# Patient Record
Sex: Female | Born: 1985 | Race: White | Hispanic: No | Marital: Single | Smoking: Current some day smoker
Health system: Southern US, Community
[De-identification: ages and names within clinical notes are randomized; demographics above are authoritative.]

## PROBLEM LIST (undated history)

## (undated) ENCOUNTER — Ambulatory Visit: Payer: MEDICAID | Source: Home / Self Care

## (undated) ENCOUNTER — Ambulatory Visit: Payer: MEDICAID

## (undated) ENCOUNTER — Inpatient Hospital Stay (HOSPITAL_COMMUNITY): Payer: Self-pay

## (undated) DIAGNOSIS — F32A Depression, unspecified: Secondary | ICD-10-CM

## (undated) DIAGNOSIS — F1910 Other psychoactive substance abuse, uncomplicated: Secondary | ICD-10-CM

## (undated) DIAGNOSIS — F329 Major depressive disorder, single episode, unspecified: Secondary | ICD-10-CM

## (undated) DIAGNOSIS — F1110 Opioid abuse, uncomplicated: Secondary | ICD-10-CM

## (undated) DIAGNOSIS — B1920 Unspecified viral hepatitis C without hepatic coma: Secondary | ICD-10-CM

## (undated) HISTORY — DX: Major depressive disorder, single episode, unspecified: F32.9

## (undated) HISTORY — PX: HAND SURGERY: SHX662

## (undated) HISTORY — PX: OTHER SURGICAL HISTORY: SHX169

## (undated) HISTORY — DX: Depression, unspecified: F32.A

## (undated) HISTORY — PX: LEG SURGERY: SHX1003

## (undated) HISTORY — PX: CHOLECYSTECTOMY: SHX55

---

## 2005-07-23 ENCOUNTER — Emergency Department (HOSPITAL_COMMUNITY): Admission: EM | Admit: 2005-07-23 | Discharge: 2005-07-23 | Payer: Self-pay | Admitting: Emergency Medicine

## 2011-02-25 ENCOUNTER — Other Ambulatory Visit (HOSPITAL_COMMUNITY): Payer: Self-pay | Admitting: Physician Assistant

## 2011-02-25 DIAGNOSIS — Q909 Down syndrome, unspecified: Secondary | ICD-10-CM

## 2011-02-25 DIAGNOSIS — IMO0001 Reserved for inherently not codable concepts without codable children: Secondary | ICD-10-CM

## 2011-03-06 ENCOUNTER — Other Ambulatory Visit (HOSPITAL_COMMUNITY): Payer: Self-pay

## 2011-03-10 ENCOUNTER — Other Ambulatory Visit (HOSPITAL_COMMUNITY): Payer: Self-pay

## 2011-03-10 ENCOUNTER — Ambulatory Visit (HOSPITAL_COMMUNITY)
Admission: RE | Admit: 2011-03-10 | Discharge: 2011-03-10 | Disposition: A | Discharge status: Home / Self Care | Payer: Medicaid Other | Source: Ambulatory Visit | Attending: Obstetrics and Gynecology | Admitting: Obstetrics and Gynecology

## 2011-03-10 ENCOUNTER — Other Ambulatory Visit (HOSPITAL_COMMUNITY): Payer: Self-pay | Admitting: Physician Assistant

## 2011-03-10 ENCOUNTER — Encounter (HOSPITAL_COMMUNITY): Payer: Self-pay

## 2011-03-10 DIAGNOSIS — Q909 Down syndrome, unspecified: Secondary | ICD-10-CM

## 2011-03-10 DIAGNOSIS — O358XX0 Maternal care for other (suspected) fetal abnormality and damage, not applicable or unspecified: Secondary | ICD-10-CM | POA: Insufficient documentation

## 2011-04-23 ENCOUNTER — Ambulatory Visit (HOSPITAL_COMMUNITY)
Admission: RE | Admit: 2011-04-23 | Discharge: 2011-04-23 | Disposition: A | Discharge status: Home / Self Care | Payer: Medicaid Other | Source: Ambulatory Visit | Attending: Obstetrics and Gynecology | Admitting: Obstetrics and Gynecology

## 2011-04-23 ENCOUNTER — Ambulatory Visit (HOSPITAL_COMMUNITY): Payer: Medicaid Other

## 2011-04-23 DIAGNOSIS — Z363 Encounter for antenatal screening for malformations: Secondary | ICD-10-CM | POA: Insufficient documentation

## 2011-04-23 DIAGNOSIS — O358XX0 Maternal care for other (suspected) fetal abnormality and damage, not applicable or unspecified: Secondary | ICD-10-CM | POA: Insufficient documentation

## 2011-04-23 DIAGNOSIS — Z1389 Encounter for screening for other disorder: Secondary | ICD-10-CM | POA: Insufficient documentation

## 2011-04-23 DIAGNOSIS — O352XX0 Maternal care for (suspected) hereditary disease in fetus, not applicable or unspecified: Secondary | ICD-10-CM | POA: Insufficient documentation

## 2011-04-23 DIAGNOSIS — Q909 Down syndrome, unspecified: Secondary | ICD-10-CM

## 2012-10-26 ENCOUNTER — Other Ambulatory Visit (HOSPITAL_COMMUNITY)
Admission: RE | Admit: 2012-10-26 | Discharge: 2012-10-26 | Disposition: A | Discharge status: Home / Self Care | Payer: Medicaid Other | Source: Ambulatory Visit | Attending: Obstetrics and Gynecology | Admitting: Obstetrics and Gynecology

## 2012-10-26 DIAGNOSIS — Z01419 Encounter for gynecological examination (general) (routine) without abnormal findings: Secondary | ICD-10-CM | POA: Insufficient documentation

## 2012-10-26 DIAGNOSIS — Z113 Encounter for screening for infections with a predominantly sexual mode of transmission: Secondary | ICD-10-CM | POA: Insufficient documentation

## 2012-10-26 LAB — OB RESULTS CONSOLE ABO/RH: RH Type: POSITIVE

## 2012-10-26 LAB — OB RESULTS CONSOLE VARICELLA ZOSTER ANTIBODY, IGG: Varicella: IMMUNE

## 2012-10-26 LAB — OB RESULTS CONSOLE HEPATITIS B SURFACE ANTIGEN: Hepatitis B Surface Ag: NEGATIVE

## 2012-12-21 NOTE — L&D Delivery Note (Signed)
Pamela Zimmerman is a 27 y.o. G2P1102 at [redacted]w[redacted]d who delivered precipitously in the MAU.  Delivery Note At 11:22 AM a viable female was delivered via Vaginal, Spontaneous Delivery (vertex presentation, too precipitous to know exactly).  APGAR: 7, 8; weight 5 lb 10.3 oz (2560 g).   Placenta status: Intact, Spontaneous.  Cord: 3 vessels with the following complications: none.  Cord pH: 7.257  Anesthesia: None  Episiotomy: None Lacerations: None Suture Repair: n/a Est. Blood Loss (mL): 300  Mom to postpartum.  Baby to NICU.  Levert Feinstein 03/23/2013, 6:13 PM  I was present for entire delivery and agree with above. NICU called and arrived shortly after delivery to examine baby. Baby did cry immediately following delivery and had good tone and color.  Napoleon Form, MD

## 2013-03-02 ENCOUNTER — Encounter: Payer: Self-pay | Admitting: *Deleted

## 2013-03-02 DIAGNOSIS — F32A Depression, unspecified: Secondary | ICD-10-CM | POA: Insufficient documentation

## 2013-03-02 DIAGNOSIS — F329 Major depressive disorder, single episode, unspecified: Secondary | ICD-10-CM | POA: Insufficient documentation

## 2013-03-15 ENCOUNTER — Other Ambulatory Visit: Payer: Self-pay | Admitting: Obstetrics & Gynecology

## 2013-03-15 DIAGNOSIS — O0933 Supervision of pregnancy with insufficient antenatal care, third trimester: Secondary | ICD-10-CM

## 2013-03-15 DIAGNOSIS — F1920 Other psychoactive substance dependence, uncomplicated: Secondary | ICD-10-CM

## 2013-03-17 ENCOUNTER — Ambulatory Visit (INDEPENDENT_AMBULATORY_CARE_PROVIDER_SITE_OTHER): Payer: Medicaid Other | Admitting: Obstetrics & Gynecology

## 2013-03-17 ENCOUNTER — Ambulatory Visit (INDEPENDENT_AMBULATORY_CARE_PROVIDER_SITE_OTHER): Payer: Medicaid Other

## 2013-03-17 VITALS — BP 140/100 | Wt 153.0 lb

## 2013-03-17 DIAGNOSIS — Z3490 Encounter for supervision of normal pregnancy, unspecified, unspecified trimester: Secondary | ICD-10-CM | POA: Insufficient documentation

## 2013-03-17 DIAGNOSIS — F1920 Other psychoactive substance dependence, uncomplicated: Secondary | ICD-10-CM

## 2013-03-17 DIAGNOSIS — O0930 Supervision of pregnancy with insufficient antenatal care, unspecified trimester: Secondary | ICD-10-CM

## 2013-03-17 DIAGNOSIS — O99330 Smoking (tobacco) complicating pregnancy, unspecified trimester: Secondary | ICD-10-CM

## 2013-03-17 DIAGNOSIS — O0933 Supervision of pregnancy with insufficient antenatal care, third trimester: Secondary | ICD-10-CM

## 2013-03-17 DIAGNOSIS — Z3483 Encounter for supervision of other normal pregnancy, third trimester: Secondary | ICD-10-CM

## 2013-03-17 DIAGNOSIS — Z331 Pregnant state, incidental: Secondary | ICD-10-CM

## 2013-03-17 DIAGNOSIS — O99320 Drug use complicating pregnancy, unspecified trimester: Secondary | ICD-10-CM

## 2013-03-17 DIAGNOSIS — Z1389 Encounter for screening for other disorder: Secondary | ICD-10-CM

## 2013-03-17 LAB — POCT URINALYSIS DIPSTICK
Glucose, UA: NEGATIVE
Leukocytes, UA: NEGATIVE
Nitrite, UA: NEGATIVE
Protein, UA: NEGATIVE

## 2013-03-17 MED ORDER — OMEPRAZOLE 20 MG PO CPDR: 20.0000 mg | DELAYED_RELEASE_CAPSULE | Freq: Every day | ORAL | Status: DC

## 2013-03-17 NOTE — Progress Notes (Signed)
U/S-35+1wks-vtx active female fetus, EFW 4 lb 12 oz (2145 gms) 16th%tile, fluid wnl AFI=12.3cm, ant gr 2 plac, profile and cardiac OFT's noted today

## 2013-03-17 NOTE — Progress Notes (Signed)
RECHECK B/P 148/100.

## 2013-03-17 NOTE — Progress Notes (Signed)
Patient reports good fetal movement, denies any bleeding and no rupture of membranes symptoms or contraction No problems, no complaints, all questions answered.  Sonogram reviewed ith patient see full report in imaging. Having esophogeal spasm try Prilosec

## 2013-03-17 NOTE — Progress Notes (Signed)
Pt states not taking pnv due to constipation.

## 2013-03-17 NOTE — Patient Instructions (Signed)
Pregnancy - Third Trimester  The third trimester of pregnancy (the last 3 months) is a period of the most rapid growth for you and your baby. The baby approaches a length of 20 inches and a weight of 6 to 10 pounds. The baby is adding on fat and getting ready for life outside your body. While inside, babies have periods of sleeping and waking, suck their thumbs, and hiccups. You can often feel small contractions of the uterus. This is false labor. It is also called Braxton-Hicks contractions. This is like a practice for labor. The usual problems in this stage of pregnancy include more difficulty breathing, swelling of the hands and feet from water retention, and having to urinate more often because of the uterus and baby pressing on your bladder.   PRENATAL EXAMS  · Blood work may continue to be done during prenatal exams. These tests are done to check on your health and the probable health of your baby. Blood work is used to follow your blood levels (hemoglobin). Anemia (low hemoglobin) is common during pregnancy. Iron and vitamins are given to help prevent this. You may also continue to be checked for diabetes. Some of the past blood tests may be done again.  · The size of the uterus is measured during each visit. This makes sure your baby is growing properly according to your pregnancy dates.  · Your blood pressure is checked every prenatal visit. This is to make sure you are not getting toxemia.  · Your urine is checked every prenatal visit for infection, diabetes and protein.  · Your weight is checked at each visit. This is done to make sure gains are happening at the suggested rate and that you and your baby are growing normally.  · Sometimes, an ultrasound is performed to confirm the position and the proper growth and development of the baby. This is a test done that bounces harmless sound waves off the baby so your caregiver can more accurately determine due dates.  · Discuss the type of pain medication and  anesthesia you will have during your labor and delivery.  · Discuss the possibility and anesthesia if a Cesarean Section might be necessary.  · Inform your caregiver if there is any mental or physical violence at home.  Sometimes, a specialized non-stress test, contraction stress test and biophysical profile are done to make sure the baby is not having a problem. Checking the amniotic fluid surrounding the baby is called an amniocentesis. The amniotic fluid is removed by sticking a needle into the belly (abdomen). This is sometimes done near the end of pregnancy if an early delivery is required. In this case, it is done to help make sure the baby's lungs are mature enough for the baby to live outside of the womb. If the lungs are not mature and it is unsafe to deliver the baby, an injection of cortisone medication is given to the mother 1 to 2 days before the delivery. This helps the baby's lungs mature and makes it safer to deliver the baby.  CHANGES OCCURING IN THE THIRD TRIMESTER OF PREGNANCY  Your body goes through many changes during pregnancy. They vary from person to person. Talk to your caregiver about changes you notice and are concerned about.  · During the last trimester, you have probably had an increase in your appetite. It is normal to have cravings for certain foods. This varies from person to person and pregnancy to pregnancy.  · You may begin to   get stretch marks on your hips, abdomen, and breasts. These are normal changes in the body during pregnancy. There are no exercises or medications to take which prevent this change.  · Constipation may be treated with a stool softener or adding bulk to your diet. Drinking lots of fluids, fiber in vegetables, fruits, and whole grains are helpful.  · Exercising is also helpful. If you have been very active up until your pregnancy, most of these activities can be continued during your pregnancy. If you have been less active, it is helpful to start an exercise  program such as walking. Consult your caregiver before starting exercise programs.  · Avoid all smoking, alcohol, un-prescribed drugs, herbs and "street drugs" during your pregnancy. These chemicals affect the formation and growth of the baby. Avoid chemicals throughout the pregnancy to ensure the delivery of a healthy infant.  · Backache, varicose veins and hemorrhoids may develop or get worse.  · You will tire more easily in the third trimester, which is normal.  · The baby's movements may be stronger and more often.  · You may become short of breath easily.  · Your belly button may stick out.  · A yellow discharge may leak from your breasts called colostrum.  · You may have a bloody mucus discharge. This usually occurs a few days to a week before labor begins.  HOME CARE INSTRUCTIONS   · Keep your caregiver's appointments. Follow your caregiver's instructions regarding medication use, exercise, and diet.  · During pregnancy, you are providing food for you and your baby. Continue to eat regular, well-balanced meals. Choose foods such as meat, fish, milk and other low fat dairy products, vegetables, fruits, and whole-grain breads and cereals. Your caregiver will tell you of the ideal weight gain.  · A physical sexual relationship may be continued throughout pregnancy if there are no other problems such as early (premature) leaking of amniotic fluid from the membranes, vaginal bleeding, or belly (abdominal) pain.  · Exercise regularly if there are no restrictions. Check with your caregiver if you are unsure of the safety of your exercises. Greater weight gain will occur in the last 2 trimesters of pregnancy. Exercising helps:  · Control your weight.  · Get you in shape for labor and delivery.  · You lose weight after you deliver.  · Rest a lot with legs elevated, or as needed for leg cramps or low back pain.  · Wear a good support or jogging bra for breast tenderness during pregnancy. This may help if worn during  sleep. Pads or tissues may be used in the bra if you are leaking colostrum.  · Do not use hot tubs, steam rooms, or saunas.  · Wear your seat belt when driving. This protects you and your baby if you are in an accident.  · Avoid raw meat, cat litter boxes and soil used by cats. These carry germs that can cause birth defects in the baby.  · It is easier to loose urine during pregnancy. Tightening up and strengthening the pelvic muscles will help with this problem. You can practice stopping your urination while you are going to the bathroom. These are the same muscles you need to strengthen. It is also the muscles you would use if you were trying to stop from passing gas. You can practice tightening these muscles up 10 times a set and repeating this about 3 times per day. Once you know what muscles to tighten up, do not perform these   exercises during urination. It is more likely to cause an infection by backing up the urine.  · Ask for help if you have financial, counseling or nutritional needs during pregnancy. Your caregiver will be able to offer counseling for these needs as well as refer you for other special needs.  · Make a list of emergency phone numbers and have them available.  · Plan on getting help from family or friends when you go home from the hospital.  · Make a trial run to the hospital.  · Take prenatal classes with the father to understand, practice and ask questions about the labor and delivery.  · Prepare the baby's room/nursery.  · Do not travel out of the city unless it is absolutely necessary and with the advice of your caregiver.  · Wear only low or no heal shoes to have better balance and prevent falling.  MEDICATIONS AND DRUG USE IN PREGNANCY  · Take prenatal vitamins as directed. The vitamin should contain 1 milligram of folic acid. Keep all vitamins out of reach of children. Only a couple vitamins or tablets containing iron may be fatal to a baby or young child when ingested.  · Avoid use  of all medications, including herbs, over-the-counter medications, not prescribed or suggested by your caregiver. Only take over-the-counter or prescription medicines for pain, discomfort, or fever as directed by your caregiver. Do not use aspirin, ibuprofen (Motrin®, Advil®, Nuprin®) or naproxen (Aleve®) unless OK'd by your caregiver.  · Let your caregiver also know about herbs you may be using.  · Alcohol is related to a number of birth defects. This includes fetal alcohol syndrome. All alcohol, in any form, should be avoided completely. Smoking will cause low birth rate and premature babies.  · Street/illegal drugs are very harmful to the baby. They are absolutely forbidden. A baby born to an addicted mother will be addicted at birth. The baby will go through the same withdrawal an adult does.  SEEK MEDICAL CARE IF:  You have any concerns or worries during your pregnancy. It is better to call with your questions if you feel they cannot wait, rather than worry about them.  DECISIONS ABOUT CIRCUMCISION  You may or may not know the sex of your baby. If you know your baby is a boy, it may be time to think about circumcision. Circumcision is the removal of the foreskin of the penis. This is the skin that covers the sensitive end of the penis. There is no proven medical need for this. Often this decision is made on what is popular at the time or based upon religious beliefs and social issues. You can discuss these issues with your caregiver or pediatrician.  SEEK IMMEDIATE MEDICAL CARE IF:   · An unexplained oral temperature above 102° F (38.9° C) develops, or as your caregiver suggests.  · You have leaking of fluid from the vagina (birth canal). If leaking membranes are suspected, take your temperature and tell your caregiver of this when you call.  · There is vaginal spotting, bleeding or passing clots. Tell your caregiver of the amount and how many pads are used.  · You develop a bad smelling vaginal discharge with  a change in the color from clear to white.  · You develop vomiting that lasts more than 24 hours.  · You develop chills or fever.  · You develop shortness of breath.  · You develop burning on urination.  · You loose more than 2 pounds of weight   or gain more than 2 pounds of weight or as suggested by your caregiver.  · You notice sudden swelling of your face, hands, and feet or legs.  · You develop belly (abdominal) pain. Round ligament discomfort is a common non-cancerous (benign) cause of abdominal pain in pregnancy. Your caregiver still must evaluate you.  · You develop a severe headache that does not go away.  · You develop visual problems, blurred or double vision.  · If you have not felt your baby move for more than 1 hour. If you think the baby is not moving as much as usual, eat something with sugar in it and lie down on your left side for an hour. The baby should move at least 4 to 5 times per hour. Call right away if your baby moves less than that.  · You fall, are in a car accident or any kind of trauma.  · There is mental or physical violence at home.  Document Released: 12/01/2001 Document Revised: 02/29/2012 Document Reviewed: 06/05/2009  ExitCare® Patient Information ©2013 [employer].

## 2013-03-20 LAB — US OB FOLLOW UP

## 2013-03-23 ENCOUNTER — Inpatient Hospital Stay (HOSPITAL_COMMUNITY)
Admission: AD | Admit: 2013-03-23 | Discharge: 2013-03-25 | DRG: 775 | Disposition: A | Discharge status: Home / Self Care | Payer: Medicaid Other | Source: Ambulatory Visit | Attending: Obstetrics & Gynecology | Admitting: Obstetrics & Gynecology

## 2013-03-23 ENCOUNTER — Encounter (HOSPITAL_COMMUNITY): Payer: Self-pay | Admitting: *Deleted

## 2013-03-23 DIAGNOSIS — Z3483 Encounter for supervision of other normal pregnancy, third trimester: Secondary | ICD-10-CM

## 2013-03-23 LAB — CBC
HCT: 39.6 % (ref 36.0–46.0)
Hemoglobin: 13.4 g/dL (ref 12.0–15.0)
MCV: 90.6 fL (ref 78.0–100.0)
RBC: 4.37 MIL/uL (ref 3.87–5.11)
WBC: 16.8 10*3/uL — ABNORMAL HIGH (ref 4.0–10.5)

## 2013-03-23 MED ORDER — OXYCODONE-ACETAMINOPHEN 5-325 MG PO TABS
1.0000 | ORAL_TABLET | ORAL | Status: DC | PRN
  Administered 2013-03-23 – 2013-03-24 (×3): 2 via ORAL
  Administered 2013-03-24 – 2013-03-25 (×3): 1 via ORAL
  Filled 2013-03-23 (×3): qty 1
  Filled 2013-03-23 (×3): qty 2

## 2013-03-23 MED ORDER — SIMETHICONE 80 MG PO CHEW: 80.0000 mg | CHEWABLE_TABLET | ORAL | Status: DC | PRN

## 2013-03-23 MED ORDER — BENZOCAINE-MENTHOL 20-0.5 % EX AERO: 1.0000 "application " | INHALATION_SPRAY | CUTANEOUS | Status: DC | PRN

## 2013-03-23 MED ORDER — DIBUCAINE 1 % RE OINT: 1.0000 "application " | TOPICAL_OINTMENT | RECTAL | Status: DC | PRN

## 2013-03-23 MED ORDER — IBUPROFEN 600 MG PO TABS
600.0000 mg | ORAL_TABLET | Freq: Four times a day (QID) | ORAL | Status: DC
  Administered 2013-03-23 – 2013-03-25 (×8): 600 mg via ORAL
  Filled 2013-03-23 (×8): qty 1

## 2013-03-23 MED ORDER — ZOLPIDEM TARTRATE 5 MG PO TABS: 5.0000 mg | ORAL_TABLET | Freq: Every evening | ORAL | Status: DC | PRN

## 2013-03-23 MED ORDER — ONDANSETRON HCL 4 MG/2ML IJ SOLN: 4.0000 mg | INTRAMUSCULAR | Status: DC | PRN

## 2013-03-23 MED ORDER — PNEUMOCOCCAL VAC POLYVALENT 25 MCG/0.5ML IJ INJ
0.5000 mL | INJECTION | INTRAMUSCULAR | Status: AC
  Administered 2013-03-25: 0.5 mL via INTRAMUSCULAR
  Filled 2013-03-23: qty 0.5

## 2013-03-23 MED ORDER — INFLUENZA VIRUS VACC SPLIT PF IM SUSP
0.5000 mL | INTRAMUSCULAR | Status: AC
  Administered 2013-03-24: 0.5 mL via INTRAMUSCULAR
  Filled 2013-03-23: qty 0.5

## 2013-03-23 MED ORDER — ONDANSETRON HCL 4 MG PO TABS: 4.0000 mg | ORAL_TABLET | ORAL | Status: DC | PRN

## 2013-03-23 MED ORDER — LANOLIN HYDROUS EX OINT: TOPICAL_OINTMENT | CUTANEOUS | Status: DC | PRN

## 2013-03-23 MED ORDER — MORPHINE SULFATE 4 MG/ML IJ SOLN
2.0000 mg | Freq: Once | INTRAMUSCULAR | Status: AC
  Administered 2013-03-23: 2 mg via INTRAMUSCULAR
  Filled 2013-03-23: qty 1

## 2013-03-23 MED ORDER — PRENATAL MULTIVITAMIN CH
1.0000 | ORAL_TABLET | Freq: Every day | ORAL | Status: DC
  Administered 2013-03-23 – 2013-03-24 (×2): 1 via ORAL
  Filled 2013-03-23 (×2): qty 1

## 2013-03-23 MED ORDER — TETANUS-DIPHTH-ACELL PERTUSSIS 5-2.5-18.5 LF-MCG/0.5 IM SUSP
0.5000 mL | Freq: Once | INTRAMUSCULAR | Status: AC
  Administered 2013-03-24: 0.5 mL via INTRAMUSCULAR
  Filled 2013-03-23: qty 0.5

## 2013-03-23 MED ORDER — SENNOSIDES-DOCUSATE SODIUM 8.6-50 MG PO TABS
2.0000 | ORAL_TABLET | Freq: Every day | ORAL | Status: DC
  Administered 2013-03-23 – 2013-03-24 (×2): 2 via ORAL

## 2013-03-23 MED ORDER — WITCH HAZEL-GLYCERIN EX PADS: 1.0000 "application " | MEDICATED_PAD | CUTANEOUS | Status: DC | PRN

## 2013-03-23 MED ORDER — DIPHENHYDRAMINE HCL 25 MG PO CAPS: 25.0000 mg | ORAL_CAPSULE | Freq: Four times a day (QID) | ORAL | Status: DC | PRN

## 2013-03-23 NOTE — H&P (Signed)
Pamela Zimmerman is a 27 y.o. female who presented to the MAU fully dilated and contracting and precipitously delivered in MAU bed. See delivery summary for complete details. Baby was transported to NICU from MAU.  History OB History   Grav Para Term Preterm Abortions TAB SAB Ect Mult Living   2 2 1 1      2     Previous pregnancy was delivered full term, no problems during that pregnancy. [facility], no problems during this pregnancy.  Past Medical History  Diagnosis Date  . Depression     had post partum depressin with last delivery  Pt denies having any medical problems  Past Surgical History  Procedure Laterality Date  . Right hand     Family History: family history includes Cancer in her mother; Cystic fibrosis in her other; Deafness in her brother; Diabetes in her father; and Stroke in her father. Cancer in family. Niece with cystic fibrosis.  Social History: Patient reports smoking 1 ppd for 5-6 years. Denies alcohol or drug use.   Medicines: heartburn medicines, PNV (took at end of pregnancy)  Allergies: none  Prenatal Transfer Tool  Maternal Diabetes: No Genetic Screening: Declined (not in records) Maternal Ultrasounds/Referrals: Normal Fetal Ultrasounds or other Referrals:  None Maternal Substance Abuse:  Yes:  Type: Smoker, Other: prenatal records show + benzos in urine Significant Maternal Medications:  Meds include: Other: records show pt took paxil, seroquel, lorazepam but unsure if took these during her pregnancy Significant Maternal Lab Results:  Lab values include: Other:  +UDS for benzos per prenatal records Other Comments:  no showed to several prenatal appointments  ROS  Dilation: 10 Effacement (%): 100 Station: Crowning Exam by:: K.WIlson,RN Blood pressure 120/78, pulse 62, temperature 98.2 F (36.8 C), temperature source Oral, resp. rate 18, last menstrual period 07/05/2012, SpO2 99.00%, unknown if  currently breastfeeding. Exam Physical Exam  Gen: NAD HEENT: normocephalic, atraumatic, MMM Heart: RRR Lungs: CTAB Abd: soft, nontender to palpation Ext: no lower extremity edema Neuro: nonfocal, speech intact  Prenatal labs: ABO, Rh: O/Positive/-- (11/06 0000) Antibody: Negative (11/06 0000) Rubella: Immune (11/06 0000) RPR: Nonreactive (11/06 0000)  HBsAg: Negative (11/06 0000)  HIV: Non-reactive (11/06 0000)  GBS:   unknown  Assessment/Plan: Pamela Zimmerman is a 27 y.o. G2P1102 at [redacted]w[redacted]d who has delivered precipitously in the MAU.  -Admit to postpartum unit for normal postpartum care (baby in NICU) -check CBC, RPR now -unsure about contraception post-delivery but would like information -considering breastfeeding    Levert Feinstein 03/23/2013, 4:19 PM  I saw and examined patient and agree with above resident note. I reviewed prenatal history, labs, imaging, vitals and fetal heart tracing. FHT was very brief since patient delivered soon after arrival. Baseline was in the 100s with decels to 80s with contractions and pushing. Baby was vigorous and cried at birth.  NICU called and arrived shortly after birth. See delivery note. Napoleon Form, MD

## 2013-03-23 NOTE — MAU Note (Signed)
Pt arrived via EMS c/o ctx q 2 min. Denies SROM at this time no urge to push. Had pt get undressed and place on monitor FHR 107 SVE 10/100/+1. Called Dr. Thad Ranger for delivery.

## 2013-03-23 NOTE — Progress Notes (Signed)
UR completed 

## 2013-03-23 NOTE — Consult Note (Signed)
Delivery Note: Asked by Dr Erin Fulling to attend delivery of this baby in MAU for prematurity at 36 weeks. [place] labs unknown at the moment.  Mom came in full dilated, ROM shortly before delivery. Precipitous labor SVD. Infant had a weak cry at birth. Bulb suctioned and stimulated. Noted to have audible grunting and retracting shortly after delivery. Apgars 7/8. Given Neopuff for peep. Shown to mom then taken to NICU for continued medical care.  Chirag Krueger Q

## 2013-03-24 ENCOUNTER — Encounter: Payer: Medicaid Other | Admitting: Obstetrics & Gynecology

## 2013-03-24 NOTE — Progress Notes (Signed)
Post Partum Day 1 Subjective: no complaints, up ad lib, voiding and tolerating PO, small lochia, plans to breastfeed, Nexplanon for contraception  Objective: Blood pressure 125/87, pulse 61, temperature 97.5 F (36.4 C), temperature source Oral, resp. rate 18, height 5\' 6"  (1.676 m), weight 69.4 kg (153 lb), last menstrual period 07/05/2012, SpO2 99.00%, unknown if currently breastfeeding. Baby in NICU 2/2 prematurity Physical Exam:  General: alert, cooperative and no distress Lochia:normal flow Chest: CTAB Heart: RRR no m/r/g Abdomen: +BS, soft, nontender,  Uterine Fundus: firm DVT Evaluation: No evidence of DVT seen on physical exam. Extremities: trace edema   Recent Labs  03/23/13 1226  HGB 13.4  HCT 39.6    Assessment/Plan: Plan for discharge tomorrow   LOS: 1 day   CRESENZO-DISHMAN,Dereon Corkery 03/24/2013, 8:04 AM

## 2013-03-24 NOTE — Lactation Note (Addendum)
This note was copied from the chart of Pamela Zimmerman. Lactation Consultation Note Mom states pumping is going well; denies questions; request pump rental. Rental packet provided, will check on later. 1330 Checked on mom, she has not filled out the pump rental packet yet; denies questions at this time. Inst mom to call when ready to complete rental packet and make down payment, and to call and request help if she has any concerns.    Patient Name: Pamela Zimmerman [id number] Date: 03/24/2013 Reason for consult: Follow-up assessment   Maternal Data    Feeding    LATCH Score/Interventions                      Lactation Tools Discussed/Used     Consult Status Consult Status: Follow-up    Octavio Manns [facility] 03/24/2013, 11:48 AM

## 2013-03-25 MED ORDER — IBUPROFEN 600 MG PO TABS: 600.0000 mg | ORAL_TABLET | Freq: Four times a day (QID) | ORAL | Status: DC | PRN

## 2013-03-25 NOTE — Progress Notes (Signed)
Discharge instructions reviewed with patient.  Patient states understanding of home care, medications, signs/symptoms to report to MD, activity and return MD office visit.  No home equipment needed.  Patient ambulated to NICU and will discharge home in stable condition and without incident.

## 2013-03-25 NOTE — Discharge Summary (Signed)
Obstetric Discharge Summary Reason for Admission: onset of labor, 10cm upon arrival to MAU via EMS Prenatal Procedures: none Intrapartum Procedures:  spontaneous vaginal delivery in MAU Postpartum Procedures: none Complications-Operative and Postpartum: GBS unknown, no time to treat prior to birth, infant to NICU Hemoglobin  Date Value Range Status  03/23/2013 13.4  12.0 - 15.0 g/dL Final     HCT  Date Value Range Status  03/23/2013 39.6  36.0 - 46.0 % Final    Physical Exam:  General: alert, cooperative and no distress Lochia: appropriate Uterine Fundus: firm Incision: n/a DVT Evaluation: No evidence of DVT seen on physical exam. Negative Homan's sign. No cords or calf tenderness. No significant calf/ankle edema.  Discharge Diagnoses: Preterm birth @ 89wks in MAU  Discharge Information: Date: 03/25/2013 Activity: pelvic rest Diet: routine Medications: PNV and Ibuprofen Condition: stable Instructions: refer to practice specific booklet Discharge to: home Follow-up Information   Follow up with [facility]. Schedule an appointment as soon as possible for a visit in 6 weeks.   Contact information:   [address] [place] [address] [phone]      Newborn Data: Live born female  Birth Weight: 5 lb 10.3 oz (2560 g) APGAR: 7, 8  Infant in NICU, will not be d/c'd w/ mother Pumping and taking milk to NICU Mom plans OP circumcision at FT Undecided about contraception  Marge Duncans 03/25/2013, 9:45 AM

## 2013-03-27 NOTE — H&P (Signed)
Attestation of Attending Supervision of Advanced Practitioner (CNM/NP): Evaluation and management procedures were performed by the Advanced Practitioner under my supervision and collaboration.  I have reviewed the Advanced Practitioner's note and chart, and I agree with the management and plan.  HARRAWAY-SMITH, Amiylah Anastos 6:31 PM     

## 2013-04-24 ENCOUNTER — Ambulatory Visit: Payer: Medicaid Other | Admitting: Women's Health

## 2013-05-09 ENCOUNTER — Ambulatory Visit: Payer: Medicaid Other | Admitting: Advanced Practice Midwife

## 2013-05-10 ENCOUNTER — Ambulatory Visit: Payer: Medicaid Other | Admitting: Advanced Practice Midwife

## 2013-09-26 ENCOUNTER — Other Ambulatory Visit: Payer: Medicaid Other | Admitting: Nurse Practitioner

## 2013-11-14 ENCOUNTER — Ambulatory Visit: Payer: Medicaid Other | Admitting: Family Medicine

## 2014-10-22 ENCOUNTER — Encounter (HOSPITAL_COMMUNITY): Payer: Self-pay | Admitting: *Deleted

## 2014-12-12 ENCOUNTER — Encounter (HOSPITAL_COMMUNITY): Payer: Self-pay | Admitting: *Deleted

## 2014-12-12 DIAGNOSIS — Z8659 Personal history of other mental and behavioral disorders: Secondary | ICD-10-CM | POA: Insufficient documentation

## 2014-12-12 DIAGNOSIS — Z720 Tobacco use: Secondary | ICD-10-CM | POA: Insufficient documentation

## 2014-12-12 DIAGNOSIS — M542 Cervicalgia: Secondary | ICD-10-CM | POA: Diagnosis not present

## 2014-12-12 DIAGNOSIS — Z79899 Other long term (current) drug therapy: Secondary | ICD-10-CM | POA: Diagnosis not present

## 2014-12-12 DIAGNOSIS — M25531 Pain in right wrist: Secondary | ICD-10-CM | POA: Insufficient documentation

## 2014-12-12 DIAGNOSIS — Z9889 Other specified postprocedural states: Secondary | ICD-10-CM | POA: Diagnosis not present

## 2014-12-12 DIAGNOSIS — M25431 Effusion, right wrist: Secondary | ICD-10-CM | POA: Insufficient documentation

## 2014-12-12 NOTE — ED Notes (Signed)
The pt  Was injected withMOLLEY  In her rt arm 2 days ago.  She reports that  Her arm is swollen and painful  lmp last week

## 2014-12-13 ENCOUNTER — Emergency Department (HOSPITAL_COMMUNITY): Payer: Medicaid Other

## 2014-12-13 ENCOUNTER — Emergency Department (HOSPITAL_COMMUNITY)
Admission: EM | Admit: 2014-12-13 | Discharge: 2014-12-13 | Disposition: A | Discharge status: Home / Self Care | Payer: Medicaid Other | Attending: Emergency Medicine | Admitting: Emergency Medicine

## 2014-12-13 DIAGNOSIS — M25539 Pain in unspecified wrist: Secondary | ICD-10-CM

## 2014-12-13 MED ORDER — IBUPROFEN 800 MG PO TABS
800.0000 mg | ORAL_TABLET | Freq: Once | ORAL | Status: AC
  Administered 2014-12-13: 800 mg via ORAL
  Filled 2014-12-13: qty 1

## 2014-12-13 MED ORDER — DOXYCYCLINE HYCLATE 100 MG PO TABS
100.0000 mg | ORAL_TABLET | Freq: Once | ORAL | Status: AC
  Administered 2014-12-13: 100 mg via ORAL
  Filled 2014-12-13: qty 1

## 2014-12-13 MED ORDER — DOXYCYCLINE HYCLATE 100 MG PO TABS: 100.0000 mg | ORAL_TABLET | Freq: Once | ORAL | Status: DC

## 2014-12-13 MED ORDER — CEPHALEXIN 250 MG PO CAPS
500.0000 mg | ORAL_CAPSULE | Freq: Once | ORAL | Status: AC
  Administered 2014-12-13: 500 mg via ORAL
  Filled 2014-12-13: qty 2

## 2014-12-13 MED ORDER — CEPHALEXIN 500 MG PO CAPS: 500.0000 mg | ORAL_CAPSULE | Freq: Two times a day (BID) | ORAL | Status: DC

## 2014-12-13 NOTE — Discharge Instructions (Signed)
Cellulitis Ms. Dayton ScrapeMurray, you were seen today for swelling in her wrist. X-ray does not reveal any injury or bone infection. Take Anaprox as prescribed and follow-up with primary care physician within one week for a wound check. If symptoms worsen come back to emergency department immediately for repeat evaluation. Thank you. Cellulitis is an infection of the skin and the tissue under the skin. The infected area is usually red and tender. This happens most often in the arms and lower legs. HOME CARE   Take your antibiotic medicine as told. Finish the medicine even if you start to feel better.  Keep the infected arm or leg raised (elevated).  Put a warm cloth on the area up to 4 times per day.  Only take medicines as told by your doctor.  Keep all doctor visits as told. GET HELP IF:  You see red streaks on the skin coming from the infected area.  Your red area gets bigger or turns a dark color.  Your bone or joint under the infected area is painful after the skin heals.  Your infection comes back in the same area or different area.  You have a puffy (swollen) bump in the infected area.  You have new symptoms.  You have a fever. GET HELP RIGHT AWAY IF:   You feel very sleepy.  You throw up (vomit) or have watery poop (diarrhea).  You feel sick and have muscle aches and pains. MAKE SURE YOU:   Understand these instructions.  Will watch your condition.  Will get help right away if you are not doing well or get worse. Document Released: 05/25/2008 Document Revised: 04/23/2014 Document Reviewed: 02/22/2012 [facility] Patient Information 2015 [employer], [place]. This information is not intended to replace advice given to you by your health care provider. Make sure you discuss any questions you have with your health care provider.

## 2014-12-13 NOTE — ED Provider Notes (Signed)
CSN: [id number]     Arrival date & time 12/12/14  2319 History  This chart was scribed for Pamela CrumbleAdeleke Alvine Mostafa, MD by Pamela Zimmerman, ED Scribe. This patient was seen in room D32C/D32C and the patient's care was started at 12:04 AM.      Chief Complaint  Patient presents with  . Wound Infection   The history is provided by the patient. No language interpreter was used.   HPI Comments: Pamela Zimmerman is a 28 y.o. female who presents to the Emergency Department complaining of wound onset 2 days prior. Pt states she injected her self with the street drug molly. She states that now her right wrist has been painful, red and swollen for the past 2 days. Pt states she has associated sweats. She states this was the first time injecting her self with molly. Denies trauma or injury to the right wrist.   Pt is also complaining of neck pain onset 2 days prior. Pt states that she first injected in her neck 2 days prior. She denies any injury or trauma to her neck.   Past Medical History  Diagnosis Date  . Depression     had post partum depressin with last delivery   Past Surgical History  Procedure Laterality Date  . Right hand     Family History  Problem Relation Age of Onset  . Cancer Mother     lung  . Diabetes Father   . Stroke Father   . Deafness Brother   . Cystic fibrosis Other    History  Substance Use Topics  . Smoking status: Current Every Day Smoker -- 0.50 packs/day    Types: Cigarettes  . Smokeless tobacco: Not on file  . Alcohol Use: No   OB History    Gravida Para Term Preterm AB TAB SAB Ectopic Multiple Living   2 2 1 1      2      Review of Systems  Musculoskeletal: Positive for joint swelling, arthralgias and neck pain.  Skin: Positive for color change and wound.  All other systems reviewed and are negative.    Allergies  Review of patient's allergies indicates no known allergies.  Home Medications   Prior to Admission medications   Medication Sig Start Date  End Date Taking? Authorizing Provider  ibuprofen (ADVIL,MOTRIN) 600 MG tablet Take 1 tablet (600 mg total) by mouth every 6 (six) hours as needed for pain. 03/25/13   Pamela Zimmerman, CNM  omeprazole (PRILOSEC) 20 MG capsule Take 1 capsule (20 mg total) by mouth daily. 03/17/13   Pamela ArmsLuther H Eure, MD   Triage Vitals: BP 123/73 mmHg  Pulse 110  Temp(Src) 98.3 F (36.8 C)  Resp 18  SpO2 98%  LMP 12/05/2014  Physical Exam  Constitutional: She is oriented to person, place, and time. She appears well-developed and well-nourished. No distress.  HENT:  Head: Normocephalic and atraumatic.  Eyes: Conjunctivae and EOM are normal. Pupils are equal, round, and reactive to light. No scleral icterus.  Neck: Normal range of motion. Neck supple. No JVD present. No tracheal deviation present. No thyromegaly present.  Cardiovascular: Normal rate, regular rhythm and normal heart sounds.  Exam reveals no gallop and no friction rub.   No murmur heard. Pulmonary/Chest: Effort normal and breath sounds normal. No respiratory distress. She has no wheezes. She exhibits no tenderness.  Abdominal: Soft. Bowel sounds are normal. She exhibits no distension and no mass. There is no tenderness. There is no rebound and no  guarding.  Musculoskeletal: Normal range of motion. She exhibits no edema or tenderness.  Lymphadenopathy:    She has no cervical adenopathy.  Neurological: She is alert and oriented to person, place, and time.  Skin: Skin is warm and dry. No rash noted. No erythema. No pallor.  Mild induration distal right forearm, no warmth, no erythema, non tender.   Psychiatric: She has a normal mood and affect. Her behavior is normal.  Nursing note and vitals reviewed.   ED Course  Procedures (including critical care time) DIAGNOSTIC STUDIES: Oxygen Saturation is 98% on RA, normal by my interpretation.    COORDINATION OF CARE: 12:26 AM-Discussed treatment plan with pt at bedside and pt agreed to plan.      Labs Review Labs Reviewed - No data to display  Imaging Review Dg Wrist Complete Right  12/13/2014   CLINICAL DATA:  Right wrist posterior pain, IV drug abuse  EXAM: RIGHT WRIST - COMPLETE 3+ VIEW  COMPARISON:  None.  FINDINGS: There is no evidence of fracture or dislocation. There is no evidence of arthropathy or other focal bone abnormality. Soft tissues are unremarkable.  IMPRESSION: Negative.   Electronically Signed   By: Pamela PellantGretchen  Zimmerman M.D.   On: 12/13/2014 01:26     EKG Interpretation None      MDM   Final diagnoses:  Wrist pain      Patient since emergency department out of concern for swelling and pain in area of injection. The area is not red and is not tender. There is some mild swelling however. Patient states that she has subjective fevers. X-ray does not reveal any subcutaneous air or concern for osteomyelitis. She was given antibiotics and Motrin in emergency department. We'll discharge home with a prescription. Vital signs remain within her normal limits, primary care follow-up is advised, she is safe for discharge.  I personally performed the services described in this documentation, which was scribed in my presence. The recorded information has been reviewed and is accurate.      Pamela CrumbleAdeleke Jacqueline Delapena, MD 12/13/14 [id number]

## 2015-04-23 ENCOUNTER — Emergency Department (HOSPITAL_COMMUNITY)
Admission: EM | Admit: 2015-04-23 | Discharge: 2015-04-23 | Disposition: A | Discharge status: Home / Self Care | Payer: Medicaid Other

## 2015-04-23 NOTE — ED Notes (Signed)
Pt.checked in stated she couldn't wait to be triage .shewalked out and left.

## 2015-09-09 ENCOUNTER — Emergency Department (HOSPITAL_COMMUNITY)
Admission: EM | Admit: 2015-09-09 | Discharge: 2015-09-09 | Disposition: A | Discharge status: Home / Self Care | Payer: Medicaid Other | Attending: Emergency Medicine | Admitting: Emergency Medicine

## 2015-09-09 ENCOUNTER — Encounter (HOSPITAL_COMMUNITY): Payer: Self-pay | Admitting: Emergency Medicine

## 2015-09-09 DIAGNOSIS — L03113 Cellulitis of right upper limb: Secondary | ICD-10-CM

## 2015-09-09 DIAGNOSIS — R1110 Vomiting, unspecified: Secondary | ICD-10-CM | POA: Insufficient documentation

## 2015-09-09 DIAGNOSIS — Z79899 Other long term (current) drug therapy: Secondary | ICD-10-CM | POA: Insufficient documentation

## 2015-09-09 DIAGNOSIS — Z792 Long term (current) use of antibiotics: Secondary | ICD-10-CM | POA: Insufficient documentation

## 2015-09-09 DIAGNOSIS — Z720 Tobacco use: Secondary | ICD-10-CM | POA: Insufficient documentation

## 2015-09-09 DIAGNOSIS — Z8659 Personal history of other mental and behavioral disorders: Secondary | ICD-10-CM | POA: Insufficient documentation

## 2015-09-09 MED ORDER — IBUPROFEN 800 MG PO TABS: 800.0000 mg | ORAL_TABLET | Freq: Three times a day (TID) | ORAL | Status: DC | PRN

## 2015-09-09 MED ORDER — DOXYCYCLINE HYCLATE 100 MG PO CAPS: 100.0000 mg | ORAL_CAPSULE | Freq: Two times a day (BID) | ORAL | Status: DC

## 2015-09-09 MED ORDER — OXYCODONE-ACETAMINOPHEN 5-325 MG PO TABS
ORAL_TABLET | ORAL | Status: AC
  Filled 2015-09-09: qty 1

## 2015-09-09 MED ORDER — OXYCODONE-ACETAMINOPHEN 5-325 MG PO TABS
1.0000 | ORAL_TABLET | Freq: Once | ORAL | Status: AC
Start: 2015-09-09 — End: 2015-09-09
  Administered 2015-09-09: 1 via ORAL

## 2015-09-09 MED ORDER — LIDOCAINE-EPINEPHRINE (PF) 2 %-1:200000 IJ SOLN
10.0000 mL | Freq: Once | INTRAMUSCULAR | Status: DC
  Filled 2015-09-09: qty 20

## 2015-09-09 NOTE — ED Provider Notes (Signed)
CSN: [id number]     Arrival date & time 09/09/15  1423 History  This chart was scribed for non-physician practitioner Santiago Glad, PA-C working with Mancel Bale, MD by Murriel Hopper, ED Scribe. This patient was seen in room TR08C/TR08C and the patient's care was started at 5:13 PM.    Chief Complaint  Patient presents with  . Abscess      The history is provided by the patient. No language interpreter was used.   HPI Comments: Pamela Zimmerman is a 29 y.o. female who presents to the Emergency Department complaining of a constant, worsening abscess on her right forearm that has been present for 3-4 days. Pt states that she used heroin 5 days ago and injected it directly into the spot where her abscess is located. Pt states she has never developed abscesses before. Pt states she is otherwise healthy. Pt denies numbness or tingling to the area or that arm.  Denies fever, chills, nausea, or vomiting.  No treatment prior to arrival.  No history of DM.     Past Medical History  Diagnosis Date  . Depression     had post partum depressin with last delivery   Past Surgical History  Procedure Laterality Date  . Right hand     Family History  Problem Relation Age of Onset  . Cancer Mother     lung  . Diabetes Father   . Stroke Father   . Deafness Brother   . Cystic fibrosis Other    Social History  Substance Use Topics  . Smoking status: Current Every Day Smoker -- 0.50 packs/day    Types: Cigarettes  . Smokeless tobacco: None  . Alcohol Use: No   OB History    Gravida Para Term Preterm AB TAB SAB Ectopic Multiple Living   Review of Systems  Constitutional: Negative for fever and chills.  Gastrointestinal: Positive for vomiting. Negative for nausea.  Skin: Positive for color change and wound.  Neurological: Negative for numbness.  All other systems reviewed and are negative.     Allergies  Review of patient's allergies indicates no known  allergies.  Home Medications   Prior to Admission medications   Medication Sig Start Date End Date Taking? Authorizing Provider  cephALEXin (KEFLEX) 500 MG capsule Take 1 capsule (500 mg total) by mouth 2 (two) times daily. 12/13/14   Tomasita Crumble, MD  doxycycline (VIBRA-TABS) 100 MG tablet Take 1 tablet (100 mg total) by mouth once. 12/13/14   Tomasita Crumble, MD  ibuprofen (ADVIL,MOTRIN) 600 MG tablet Take 1 tablet (600 mg total) by mouth every 6 (six) hours as needed for pain. 03/25/13   Cheral Marker, CNM  omeprazole (PRILOSEC) 20 MG capsule Take 1 capsule (20 mg total) by mouth daily. 03/17/13   Lazaro Arms, MD   BP 111/77 mmHg  Pulse 90  Temp(Src) 98.1 F (36.7 C) (Oral)  Resp 16  Ht  (1.702 m)  Wt 130 lb (58.968 kg)  BMI 20.36 kg/m2  SpO2 100%  LMP 08/19/2015 Physical Exam  Constitutional: She is oriented to person, place, and time. She appears well-developed and well-nourished.  HENT:  Head: Normocephalic and atraumatic.  Cardiovascular: Normal rate and regular rhythm.   Pulses:      Radial pulses are 2+ on the right side.  Pulmonary/Chest: Effort normal and breath sounds normal.  Abdominal: She exhibits no distension.  Musculoskeletal:  Right elbow: She exhibits normal range of motion. No tenderness found.       Right wrist: She exhibits normal range of motion and no tenderness.  2+ radial pulse in right arm  Neurological: She is alert and oriented to person, place, and time.  Distal sensation of the right hand intact  Skin: Skin is warm and dry.  1.5 cm area of induration and erythema of the volar aspect of the right forearm near the site of track mark No erythematous streaking No active drainage   Psychiatric: She has a normal mood and affect.  Nursing note and vitals reviewed.   ED Course  Procedures (including critical care time)  DIAGNOSTIC STUDIES: Oxygen Saturation is 100% on room air, normal by my interpretation.    COORDINATION OF  CARE: 5:18 PM Discussed treatment plan with pt at bedside and pt agreed to plan.   Labs Review Labs Reviewed - No data to display  Imaging Review No results found. I have personally reviewed and evaluated these images and lab results as part of my medical decision-making.   EKG Interpretation None      MDM   Final diagnoses:  None   Patient presents with swelling at the site where she injected heroin 5 days ago.  Mild erythema and induration of the area.  No drainage.  No erythematous streaking.  She is afebrile and non toxic appearing.  Patient discharged with antibiotics and instructed to take NSAIDs for pain.  Strict return precautions given.  I personally performed the services described in this documentation, which was scribed in my presence. The recorded information has been reviewed and is accurate.    Santiago Glad, PA-C 09/10/15 2353  Arby Barrette, MD 09/17/15 [phone]

## 2015-09-09 NOTE — ED Notes (Signed)
Declined W/C at D/C and was escorted to lobby by RN. 

## 2015-09-09 NOTE — ED Notes (Signed)
Pt from home for eval of abscess to right forearm, pt states she is an IV drug user and has used that arm before to administer drugs. Pt denies using any heroin in 5 days, denies any n/v/d or fevers. nad noted.

## 2016-03-25 ENCOUNTER — Emergency Department (HOSPITAL_COMMUNITY)
Admission: EM | Admit: 2016-03-25 | Discharge: 2016-03-25 | Disposition: A | Discharge status: Home / Self Care | Payer: Medicaid Other | Attending: Emergency Medicine | Admitting: Emergency Medicine

## 2016-03-25 ENCOUNTER — Encounter (HOSPITAL_COMMUNITY): Payer: Self-pay | Admitting: Adult Health

## 2016-03-25 DIAGNOSIS — F17210 Nicotine dependence, cigarettes, uncomplicated: Secondary | ICD-10-CM | POA: Insufficient documentation

## 2016-03-25 DIAGNOSIS — L02414 Cutaneous abscess of left upper limb: Secondary | ICD-10-CM | POA: Insufficient documentation

## 2016-03-25 NOTE — ED Notes (Signed)
Attempted to call for patient again in subwaiting and main lobby with no response.

## 2016-03-25 NOTE — ED Notes (Signed)
Attempted to call for patient in subwaiting and main lobby.  Nurse first states patient keeps going outside to smoke.  Will call for patient again with next available room.

## 2016-03-25 NOTE — ED Notes (Signed)
Presents with indurated red area to left lateral forearm began 3-4 days ago she endorses feeling warm at times and having pain. endoreses two area underneath right axilla as well/.

## 2016-03-26 ENCOUNTER — Emergency Department (HOSPITAL_COMMUNITY)
Admission: EM | Admit: 2016-03-26 | Discharge: 2016-03-26 | Disposition: A | Discharge status: Home / Self Care | Payer: Medicaid Other | Attending: Emergency Medicine | Admitting: Emergency Medicine

## 2016-03-26 ENCOUNTER — Encounter (HOSPITAL_COMMUNITY): Payer: Self-pay | Admitting: *Deleted

## 2016-03-26 DIAGNOSIS — Z3202 Encounter for pregnancy test, result negative: Secondary | ICD-10-CM | POA: Insufficient documentation

## 2016-03-26 DIAGNOSIS — Z792 Long term (current) use of antibiotics: Secondary | ICD-10-CM | POA: Insufficient documentation

## 2016-03-26 DIAGNOSIS — F1910 Other psychoactive substance abuse, uncomplicated: Secondary | ICD-10-CM | POA: Insufficient documentation

## 2016-03-26 DIAGNOSIS — Z79899 Other long term (current) drug therapy: Secondary | ICD-10-CM | POA: Insufficient documentation

## 2016-03-26 DIAGNOSIS — R350 Frequency of micturition: Secondary | ICD-10-CM | POA: Insufficient documentation

## 2016-03-26 DIAGNOSIS — L02414 Cutaneous abscess of left upper limb: Secondary | ICD-10-CM | POA: Insufficient documentation

## 2016-03-26 DIAGNOSIS — F17210 Nicotine dependence, cigarettes, uncomplicated: Secondary | ICD-10-CM | POA: Insufficient documentation

## 2016-03-26 DIAGNOSIS — R300 Dysuria: Secondary | ICD-10-CM | POA: Insufficient documentation

## 2016-03-26 LAB — BASIC METABOLIC PANEL
Anion gap: 12 (ref 5–15)
BUN: 8 mg/dL (ref 6–20)
CHLORIDE: 105 mmol/L (ref 101–111)
CO2: 22 mmol/L (ref 22–32)
CREATININE: 0.8 mg/dL (ref 0.44–1.00)
Calcium: 9 mg/dL (ref 8.9–10.3)
Glucose, Bld: 84 mg/dL (ref 65–99)
POTASSIUM: 4.1 mmol/L (ref 3.5–5.1)
SODIUM: 139 mmol/L (ref 135–145)

## 2016-03-26 LAB — CBC
HCT: 44.7 % (ref 36.0–46.0)
Hemoglobin: 15 g/dL (ref 12.0–15.0)
MCH: 29.9 pg (ref 26.0–34.0)
MCHC: 33.6 g/dL (ref 30.0–36.0)
MCV: 89.2 fL (ref 78.0–100.0)
PLATELETS: 257 10*3/uL (ref 150–400)
RBC: 5.01 MIL/uL (ref 3.87–5.11)
RDW: 14.2 % (ref 11.5–15.5)
WBC: 8.6 10*3/uL (ref 4.0–10.5)

## 2016-03-26 LAB — URINALYSIS, ROUTINE W REFLEX MICROSCOPIC
Bilirubin Urine: NEGATIVE
GLUCOSE, UA: NEGATIVE mg/dL
Hgb urine dipstick: NEGATIVE
Ketones, ur: NEGATIVE mg/dL
LEUKOCYTES UA: NEGATIVE
Nitrite: NEGATIVE
PH: 5.5 (ref 5.0–8.0)
PROTEIN: NEGATIVE mg/dL
Specific Gravity, Urine: 1.011 (ref 1.005–1.030)

## 2016-03-26 LAB — I-STAT BETA HCG BLOOD, ED (MC, WL, AP ONLY): I-stat hCG, quantitative: <5 m[IU]/mL (ref <5)

## 2016-03-26 MED ORDER — SULFAMETHOXAZOLE-TRIMETHOPRIM 800-160 MG PO TABS
1.0000 | ORAL_TABLET | Freq: Once | ORAL | Status: AC
  Administered 2016-03-26: 1 via ORAL
  Filled 2016-03-26: qty 1

## 2016-03-26 MED ORDER — SULFAMETHOXAZOLE-TRIMETHOPRIM 800-160 MG PO TABS: 1.0000 | ORAL_TABLET | Freq: Two times a day (BID) | ORAL | Status: AC

## 2016-03-26 MED ORDER — NALOXONE HCL 4 MG/0.1ML NA LIQD: 4.0000 mg | NASAL | Status: DC | PRN

## 2016-03-26 MED ORDER — LIDOCAINE-EPINEPHRINE (PF) 2 %-1:200000 IJ SOLN
10.0000 mL | Freq: Once | INTRAMUSCULAR | Status: AC
  Administered 2016-03-26: 10 mL via INTRADERMAL
  Filled 2016-03-26: qty 20

## 2016-03-26 MED ORDER — IBUPROFEN 200 MG PO TABS
600.0000 mg | ORAL_TABLET | Freq: Once | ORAL | Status: AC
  Administered 2016-03-26: 600 mg via ORAL
  Filled 2016-03-26: qty 1

## 2016-03-26 NOTE — ED Notes (Signed)
Pt is here with abscess area to left lateral lower forearm and states she has 2 under her right axilla area.  Pt also thinks she has a urinary tract infection:  Frequency and burning.

## 2016-03-26 NOTE — Discharge Instructions (Signed)
Incision and Drainage Incision and drainage is a procedure in which a sac-like structure (cystic structure) is opened and drained. The area to be drained usually contains material such as pus, fluid, or blood.  LET YOUR CAREGIVER KNOW ABOUT:   Allergies to medicine.  Medicines taken, including vitamins, herbs, eyedrops, over-the-counter medicines, and creams.  Use of steroids (by mouth or creams).  Previous problems with anesthetics or numbing medicines.  History of bleeding problems or blood clots.  Previous surgery.  Other health problems, including diabetes and kidney problems.  Possibility of pregnancy, if this applies. RISKS AND COMPLICATIONS  Pain.  Bleeding.  Scarring.  Infection. BEFORE THE PROCEDURE  You may need to have an ultrasound or other imaging tests to see how large or deep your cystic structure is. Blood tests may also be used to determine if you have an infection or how severe the infection is. You may need to have a tetanus shot. PROCEDURE  The affected area is cleaned with a cleaning fluid. The cyst area will then be numbed with a medicine (local anesthetic). A small incision will be made in the cystic structure. A syringe or catheter may be used to drain the contents of the cystic structure, or the contents may be squeezed out. The area will then be flushed with a cleansing solution. After cleansing the area, it is often gently packed with a gauze or another wound dressing. Once it is packed, it will be covered with gauze and tape or some other type of wound dressing. AFTER THE PROCEDURE   Often, you will be allowed to go home right after the procedure.  You may be given antibiotic medicine to prevent or heal an infection.  If the area was packed with gauze or some other wound dressing, you will likely need to come back in 1 to 2 days to get it removed.  The area should heal in about 14 days.   This information is not intended to replace advice given  to you by your health care provider. Make sure you discuss any questions you have with your health care provider.   Document Released: 06/02/2001 Document Revised: 06/07/2012 Document Reviewed: 02/01/2012 Elsevier Interactive Patient Education 2016 [employer]. [employer] Guide Outpatient Counseling/Substance Abuse Adult The United Ways 211 is a great source of information about community services available.  Access by dialing 2-1-1 from anywhere in [place], or by website -  PooledIncome.pl.   Other Local Resources (Updated 12/2015)  Crisis Hotlines   Services     Area Served  [employer]  Crisis Hotline, available 24 hours a day, 7 days a week: 667-391-2458 [facility], [place]   [facility]  Crisis Hotline, available 24 hours a day, 7 days a week: (585)614-1689 [facility], [place]  [facility]  Suicide Prevention Hotline, available 24 hours a day, 7 days a week: 415-172-3932 [facility], [place]  [employer], available 24 hours a day, 7 days a week: 743-297-5405 [facility], [place]   [facility] Access to [employer], available 24 hours a day, 7 days a week: 256-294-5932 All   [facility]  Crisis Hotline, available 24 hours a day, 7 days a week: 636 277 1661 All   Other Local Resources (Updated 12/2015)  Outpatient Counseling/ Substance Abuse Programs  Services     Address and Phone Number  ADS (Alcohol and Drug Services)   Options include Individual counseling, group counseling, intensive outpatient program (several hours a day,  several days a week)  Offers depression assessments  Provides methadone maintenance program [phone] [address] [place], [place] [address]   [facility]   Offers partial hospitalization/day treatment and DUI/DWI programs  [employer], private insurance [phone] [address] [place], [place] [address]  [facility]    Services include intensive outpatient program (several hours a day, several days a week), outpatient treatment, DUI/DWI services, family education  Also has some services specifically for [employer] transitional housing  [phone] [address] [place], [place] [address]     [facility]  [employer], private pay, and private insurance [phone] [address], [address] [place], [place] [address]  [employer] [facility]  Services include individual counseling, substance abuse intensive outpatient program (several hours a day, several days a week), day treatment  Delene Loll, Medicaid, private insurance [phone] [address] [place], [place] [address]  [facility]   Offers substance abuse intensive outpatient program (several hours a day, several days a week), partial hospitalization program [phone] [address] [place], [place] [address]  [phone] [address] [place], [place] [address]  [phone] [address] [place], [place] [address]  [phone], [address] [place], [place] [address]  [facility]  Individual counseling only  Accepts private insurance only [phone] [address] [place], [place] [address]  [facility]  Methadone maintenance program [phone] [address] [place], [place] [address]  [facility]  Walk-In Clinic providing substance abuse and mental health counseling  Accepts Medicaid, Medicare, private insurance  Offers sliding scale for uninsured [phone] [address] [place], [place]   [facility] [place], [employer].  Offers individual counseling, and intensive in-home services [phone] [address] [place], [place] [address]  [facility] individual  counseling, family counseling, group therapy, domestic violence counseling, consumer credit counseling  Accepts Medicare, Medicaid, private insurance  Offers sliding scale for uninsured [phone] [address] [place], [place] [address]  [phone] [facility], [address] [place], [place] [address]  [facility]  Offers individual, family and group counseling  3 locations - [place], [place], and [place]  [phone]  [address] [place], [place] [address]  [address] [place], [place] [address]  [address] [place], [place] [address]  [facility]    Offers psychiatric assessment, 8-week Intensive Outpatient Program (several hours a day, several times a week, daytime or evenings), early recovery group, family Program, medication management  Private pay or private insurance only [phone], or  [phone] [address] [place], [place] [address]  [facility] [employer] individual, couples and family counseling  Accepts Medicaid, private insurance, and sliding scale for uninsured [phone] [address] [place], [place] [address]  Len Blalock, MD  Individual counseling  Private insurance [phone] [address] [place], [place] [address]  [facility]   Offers assessment, substance abuse treatment, and behavioral health treatment [phone] [address] [place], [place] [address]  [facility]  Individual counseling  Accepts private insurance [phone] [address] [place], [place] [address]  [facility]  Individual counseling  Delene Loll, private insurance [phone] [address] [place], [place] [address]  [facility]    Offers intensive outpatient program (several hours a day, several times a week)  Private pay,  private insurance [phone] 483 [address] [facility] [address] [place], [place]  [facility]  Individual counseling  Medicare, private insurance [phone] [address], [address] [place], [place] [address]  [facility]    Offers intensive outpatient program (several hours a day, several times a week) and partial hospitalization program [phone] [address] [place], [place] [address]  Emerson MonteParrish McKinney, MD  Individual counseling [phone] [address] [place], [place] [address]  [facility]  Offers Christian counseling to individuals, couples, and families  Accepts Medicare and private insurance; offers sliding scale for uninsured [phone] [address] [place], [place] [address]  [facility]  Altohristian counseling [phone] [address], [address] [place], [place] [address]  [facility]   Offers crisis counseling, individual counseling, group therapy, in-home therapy, domestic violence services, day treatment, DWI services, Administrator, artsCommunity Support Team (CST), Doctor, hospitalAssertive Community Treatment Team (ACTT), substance abuse Intensive Outpatient Program (several hours a day, several times a week)  2 locations - [place] and [place] [phone] [address] [place], [place] [address]  [phone] [address] [place], [place] [address]  [facility]     Individual counseling and group therapy  [employer] private insurance, [place]are, [place] [phone] [address] [place], [place]  [facility]  Offers individual and family counseling  Offers LGBTQ services  Accepts private insurance and private pay [phone] [address] [place], [place] [address]  [facility]    Offers individual counseling, group therapy, and outpatient detox  Accepts private insurance [phone] [address] [place], [place]  [facility]  Individual counseling  Accepts Medicare,  private insurance [phone] [address] [place], [place] [address]  [facility]  Individual counseling  Accepts Medicare, private insurance [phone] [address] [place], [place] [address]  [facility]   Offers substance abuse Intensive Outpatient Program (several hours a day, several times a week) [phone], or [phone] 511 8719 [place], [place]  Polysubstance Abuse When people abuse more than one drug or type of drug it is called polysubstance or polydrug abuse. For example, many smokers also drink alcohol. This is one form of polydrug abuse. Polydrug abuse also refers to the use of a drug to counteract an unpleasant effect produced by another drug. It may also be used to help with withdrawal from another drug. People who take stimulants may become agitated. Sometimes this agitation is countered with a tranquilizer. This helps protect against the unpleasant side effects. Polydrug abuse also refers to the use of different drugs at the same time.  Anytime drug use is interfering with normal living activities, it has become abuse. This includes problems with family and friends. Psychological dependence has developed when your mind tells you that the drug is needed. This is usually followed by physical dependence which has developed when continuing increases of drug are required to get the same feeling or "high". This is known as addiction or chemical dependency. A person's risk is much higher if there is a history of chemical dependency in the family. SIGNS OF CHEMICAL DEPENDENCY  You have been told by friends or family that drugs have become a problem.  You fight when using drugs.  You are having blackouts (not remembering what you do while using).  You feel sick from using drugs but continue using.  You lie about use or amounts of drugs (chemicals) used.  You need chemicals to get you going.  You are suffering  in work performance or in  school because of drug use.  You get sick from use of drugs but continue to use anyway.  You need drugs to relate to people or feel comfortable in social situations.  You use drugs to forget problems. "Yes" answered to any of the above signs of chemical dependency indicates there are problems. The longer the use of drugs continues, the greater the problems will become. If there is a family history of drug or alcohol use, it is best not to experiment with these drugs. Continual use leads to tolerance. After tolerance develops more of the drug is needed to get the same feeling. This is followed by addiction. With addiction, drugs become the most important part of life. It becomes more important to take drugs than participate in the other usual activities of life. This includes relating to friends and family. Addiction is followed by dependency. Dependency is a condition where drugs are now needed not just to get high, but to feel normal. Addiction cannot be cured but it can be stopped. This often requires outside help and the care of professionals. Treatment centers are listed in the yellow pages under: Cocaine, Narcotics, and Alcoholics Anonymous. Most hospitals and clinics can refer you to a specialized care center. Talk to your caregiver if you need help.   This information is not intended to replace advice given to you by your health care provider. Make sure you discuss any questions you have with your health care provider.   Document Released: 07/29/2005 Document Revised: 02/29/2012 Document Reviewed: 12/12/2014 Elsevier Interactive Patient Education 2016 Elsevier Inc. Naloxone nasal spray What is this medicine? NALOXONE (nal OX one) is a narcotic blocker. It is used to treat narcotic drug overdose. This medicine may be used for other purposes; ask your health care provider or pharmacist if you have questions. What should I tell my health care provider before I take this medicine? They need  to know if you have any of these conditions: -drug abuse or addiction -heart disease -an unusual or allergic reaction to naloxone, other medicines, foods, dyes, or preservatives -pregnant or trying to get pregnant -breast-feeding How should I use this medicine? This medicine is for use in the nose. Lay the person on their back. Support their neck with your hand and allow the head to tilt back before giving the medicine. The nasal spray should be given into 1 nostril. After giving the medicine, move the person onto their side. Do not remove or test the nasal spray until ready to use. Get emergency medical help right away after giving the first dose of this medicine, even if the person wakes up. You should be familiar with how to recognize the signs and symptoms of a narcotic overdose. If more doses are needed, give the additional dose in the other nostril. Talk to your pediatrician regarding the use of this medicine in children. While this drug may be prescribed for children as young as newborns for selected conditions, precautions do apply. Overdosage: If you think you have taken too much of this medicine contact a poison control center or emergency room at once. NOTE: This medicine is only for you. Do not share this medicine with others. What if I miss a dose? This does not apply. What may interact with this medicine? This medicine is only used during an emergency. No interactions are expected during emergency use. This list may not describe all possible interactions. Give your health care provider a list of all the  medicines, herbs, non-prescription drugs, or dietary supplements you use. Also tell them if you smoke, drink alcohol, or use illegal drugs. Some items may interact with your medicine. What should I watch for while using this medicine? Keep this medicine ready for use in the case of a narcotic overdose. Make sure that you have the phone number of your doctor or health care professional  and local hospital ready. You may need to have additional doses of this medicine. Each nasal spray contains a single dose. Some emergencies may require additional doses. After use, bring the treated person to the nearest hospital or call 911. Make sure the treating health care professional knows that the person has received an injection of this medicine. You will receive additional instructions on what to do during and after use of this medicine before an emergency occurs. What side effects may I notice from receiving this medicine? Side effects that you should report to your doctor or health care professional as soon as possible: -allergic reactions like skin rash, itching or hives, swelling of the face, lips, or tongue -breathing problems -fast, irregular heartbeat -high blood pressure -pain that was controlled by narcotic pain medicine -seizures Side effects that usually do not require medical attention (report these to your doctor or health care professional if they continue or are bothersome): -anxious -chills -diarrhea -fever -headache -muscle pain -nausea, vomiting -nose irritation like dryness, congestion, and swelling -sweating This list may not describe all possible side effects. Call your doctor for medical advice about side effects. You may report side effects to FDA at 1-800-FDA-1088. Where should I keep my medicine? Keep out of the reach of children. Store between 4 and 40 degrees C (39 and 104 degrees F). Do not freeze. Throw away any unused medicine after the expiration date. Keep in original box until ready to use. NOTE: This sheet is a summary. It may not cover all possible information. If you have questions about this medicine, talk to your doctor, pharmacist, or health care provider.    2016, Elsevier/Gold Standard. (2014-11-23 14:24:03)

## 2016-03-26 NOTE — ED Notes (Signed)
Suture cart at bedside 

## 2016-03-26 NOTE — ED Provider Notes (Signed)
CSN: [id number]     Arrival date & time 03/26/16  0913 History   First MD Initiated Contact with Patient 03/26/16 [id number]     Chief Complaint  Patient presents with  . Abscess  . Urinary Frequency     (Consider location/radiation/quality/duration/timing/severity/associated sxs/prior Treatment) HPI  30 year old female history of IV drug abuse with abscess on her left forearm where she shot up 5 days ago. She states that this has been present for about 5 days. The redness is increasing in the area. It is painful. She has not had any fever or chills. She has had some similar symptoms in the past but they have cleared up. She is also having urinary frequency and pain with urination.  Past Medical History  Diagnosis Date  . Depression     had post partum depressin with last delivery   Past Surgical History  Procedure Laterality Date  . Right hand     Family History  Problem Relation Age of Onset  . Cancer Mother     lung  . Diabetes Father   . Stroke Father   . Deafness Brother   . Cystic fibrosis Other    Social History  Substance Use Topics  . Smoking status: Current Every Day Smoker -- 0.50 packs/day    Types: Cigarettes  . Smokeless tobacco: None  . Alcohol Use: No   OB History    Gravida Para Term Preterm AB TAB SAB Ectopic Multiple Living   Review of Systems  All other systems reviewed and are negative.     Allergies  Review of patient's allergies indicates no known allergies.  Home Medications   Prior to Admission medications   Medication Sig Start Date End Date Taking? Authorizing Provider  cephALEXin (KEFLEX) 500 MG capsule Take 1 capsule (500 mg total) by mouth 2 (two) times daily. 12/13/14   Tomasita Crumble, MD  doxycycline (VIBRAMYCIN) 100 MG capsule Take 1 capsule (100 mg total) by mouth 2 (two) times daily. 09/09/15   Heather Laisure, PA-C  ibuprofen (ADVIL,MOTRIN) 800 MG tablet Take 1 tablet (800 mg total) by mouth every 8 (eight) hours  as needed. 09/09/15   Heather Laisure, PA-C  omeprazole (PRILOSEC) 20 MG capsule Take 1 capsule (20 mg total) by mouth daily. 03/17/13   Lazaro Arms, MD   BP 95/60 mmHg  Pulse 93  Temp(Src) 97.9 F (36.6 C) (Oral)  Resp 16  SpO2 98%  LMP 03/21/2016 (Exact Date) Physical Exam  Constitutional: She appears well-developed and well-nourished. No distress.  HENT:  Head: Normocephalic.  Eyes: Conjunctivae are normal.  Neck: Normal range of motion. Neck supple.  Cardiovascular: Normal rate, regular rhythm and normal heart sounds.  Exam reveals no gallop.   No murmur heard. Pulmonary/Chest: Effort normal and breath sounds normal.  Abdominal: Soft.  Musculoskeletal: Normal range of motion.       Left wrist: She exhibits tenderness and bony tenderness.       Arms: 2 x 2 centimeter area of induration with erythema and fluctuance  Nursing note and vitals reviewed.   ED Course  .Marland KitchenIncision and Drainage Date/Time: 03/26/2016 10:24 AM Performed by: Margarita Grizzle Authorized by: Margarita Grizzle Consent: Verbal consent obtained. Patient identity confirmed: verbally with patient Time out: Immediately prior to procedure a "time out" was called to verify the correct patient, procedure, equipment, support staff and site/side marked as required. Type: abscess Body area: upper extremity Location  details: left arm Anesthesia: local infiltration Local anesthetic: lidocaine 1% with epinephrine Patient sedated: no Scalpel size: 11 Needle gauge: 22 Incision type: single straight Incision depth: dermal Drainage: purulent Patient tolerance: Patient tolerated the procedure well with no immediate complications   (including critical care time) Labs Review Labs Reviewed  URINALYSIS, ROUTINE W REFLEX MICROSCOPIC (NOT AT [facility])  BASIC METABOLIC PANEL  CBC  I-STAT BETA HCG BLOOD, ED ([facility], [facility], [facility] ONLY)    Imaging Review No results found. I have personally reviewed and evaluated these images and lab  results as part of my medical decision-making.   EKG Interpretation None      MDM   Final diagnoses:  Abscess of left upper extremity  IV drug abuse       Margarita Grizzleanielle Journey [place], MD 03/26/16 1540

## 2016-04-22 DIAGNOSIS — F1910 Other psychoactive substance abuse, uncomplicated: Secondary | ICD-10-CM | POA: Insufficient documentation

## 2016-04-22 DIAGNOSIS — F17210 Nicotine dependence, cigarettes, uncomplicated: Secondary | ICD-10-CM | POA: Insufficient documentation

## 2016-04-22 DIAGNOSIS — L02414 Cutaneous abscess of left upper limb: Secondary | ICD-10-CM | POA: Insufficient documentation

## 2016-04-23 ENCOUNTER — Emergency Department (HOSPITAL_COMMUNITY)
Admission: EM | Admit: 2016-04-23 | Discharge: 2016-04-23 | Disposition: A | Discharge status: Home / Self Care | Payer: Medicaid Other | Attending: Emergency Medicine | Admitting: Emergency Medicine

## 2016-04-23 ENCOUNTER — Encounter (HOSPITAL_COMMUNITY): Payer: Self-pay | Admitting: Emergency Medicine

## 2016-04-23 DIAGNOSIS — L02414 Cutaneous abscess of left upper limb: Secondary | ICD-10-CM

## 2016-04-23 DIAGNOSIS — F1910 Other psychoactive substance abuse, uncomplicated: Secondary | ICD-10-CM

## 2016-04-23 HISTORY — DX: Other psychoactive substance abuse, uncomplicated: F19.10

## 2016-04-23 HISTORY — DX: Opioid abuse, uncomplicated: F11.10

## 2016-04-23 MED ORDER — SULFAMETHOXAZOLE-TRIMETHOPRIM 800-160 MG PO TABS: 1.0000 | ORAL_TABLET | Freq: Two times a day (BID) | ORAL | Status: AC

## 2016-04-23 MED ORDER — CEPHALEXIN 500 MG PO CAPS: 500.0000 mg | ORAL_CAPSULE | Freq: Four times a day (QID) | ORAL | Status: DC

## 2016-04-23 MED ORDER — LIDOCAINE HCL (PF) 1 % IJ SOLN
INTRAMUSCULAR | Status: AC
  Administered 2016-04-23: 5 mL
  Filled 2016-04-23: qty 5

## 2016-04-23 MED ORDER — LIDOCAINE HCL (PF) 1 % IJ SOLN
5.0000 mL | Freq: Once | INTRAMUSCULAR | Status: AC
  Administered 2016-04-23: 5 mL via INTRADERMAL
  Filled 2016-04-23: qty 5

## 2016-04-23 MED ORDER — SULFAMETHOXAZOLE-TRIMETHOPRIM 800-160 MG PO TABS
1.0000 | ORAL_TABLET | Freq: Once | ORAL | Status: AC
  Administered 2016-04-23: 1 via ORAL
  Filled 2016-04-23: qty 1

## 2016-04-23 MED ORDER — CEFTRIAXONE SODIUM 1 G IJ SOLR
1.0000 g | Freq: Once | INTRAMUSCULAR | Status: AC
  Administered 2016-04-23: 1 g via INTRAMUSCULAR
  Filled 2016-04-23: qty 10

## 2016-04-23 NOTE — ED Provider Notes (Signed)
CSN: [id number]     Arrival date & time 04/22/16  2333 History  By signing my name below, I, Bethel Born, attest that this documentation has been prepared under the direction and in the presence of Geoffery Lyons, MD. Electronically Signed: Bethel Born, ED Scribe. 04/23/2016. 2:11 AM   Chief Complaint  Patient presents with  . Abscess    The history is provided by the patient. No language interpreter was used.   Pamela Zimmerman is a 30 y.o. female with PMHx of IV drug abuse who presents to the Emergency Department complaining of a painful abscess at the left anterior forearm with onset 8 days ago. The abscess is at the area of an injection site. Pt denies any drainage from the area.  Pt denies fever and chills.   Past Medical History  Diagnosis Date  . Depression     had post partum depressin with last delivery  . Drug abuse   . Heroin abuse    Past Surgical History  Procedure Laterality Date  . Right hand     Family History  Problem Relation Age of Onset  . Cancer Mother     lung  . Diabetes Father   . Stroke Father   . Deafness Brother   . Cystic fibrosis Other    Social History  Substance Use Topics  . Smoking status: Current Every Day Smoker -- 0.50 packs/day    Types: Cigarettes  . Smokeless tobacco: None  . Alcohol Use: No   OB History    Gravida Para Term Preterm AB TAB SAB Ectopic Multiple Living   Review of Systems  10 Systems reviewed and all are negative for acute change except as noted in the HPI.  Allergies  Review of patient's allergies indicates no known allergies.  Home Medications   Prior to Admission medications   Medication Sig Start Date End Date Taking? Authorizing Provider  Naloxone HCl (NARCAN) 4 MG/0.1ML LIQD Place 4 mg into the nose as needed. 03/26/16   Margarita Grizzle, MD   BP 128/88 mmHg  Pulse 58  Temp(Src) 98.3 F (36.8 C) (Oral)  Resp 16  Ht  (1.676 m)  Wt 130 lb (58.968 kg)  BMI 20.99 kg/m2   SpO2 98%  LMP 04/19/2016 Physical Exam  Constitutional: She is oriented to person, place, and time. She appears well-developed and well-nourished.  HENT:  Head: Normocephalic.  Eyes: EOM are normal.  Neck: Normal range of motion.  Pulmonary/Chest: Effort normal.  Abdominal: She exhibits no distension.  Musculoskeletal: Normal range of motion.  Neurological: She is alert and oriented to person, place, and time.  Skin:  Medial aspect of the left forearm is noted to have a 2 cm, round, fluctuant, erythematous area.   Psychiatric: She has a normal mood and affect.  Nursing note and vitals reviewed.   ED Course  Procedures (including critical care time)  INCISION AND DRAINAGE PROCEDURE NOTE: Patient identification was confirmed and verbal consent was obtained. This procedure was performed by Geoffery Lyons, MD at 2:10 AM. Site: Left forearm Sterile procedures observed Needle size: 25 Anesthetic used (type and amt): 1% lidocaine 2 mL Blade size: 11 Drainage: Mild to moderate Complexity: Simple Site anesthetized, incision made over site, wound drained and explored loculations, rinsed with copious amounts of normal saline, wound covered with dry, sterile dressing.  Pt tolerated procedure well without complications.  Instructions for care discussed verbally  and pt provided with additional written instructions for homecare and f/u.   DIAGNOSTIC STUDIES: Oxygen Saturation is 100% on RA,  normal by my interpretation.    COORDINATION OF CARE: 1:40 AM Discussed treatment plan which includes I&D with pt at bedside and pt agreed to plan.  Labs Review Labs Reviewed - No data to display  Imaging Review No results found.   EKG Interpretation None      MDM   Final diagnoses:  None    Abscess I&Ded. She will be discharged with warm soaks, Keflex, and Bactrim. She is to return as needed for any problems.  I personally performed the services described in this documentation,  which was scribed in my presence. The recorded information has been reviewed and is accurate.       Geoffery Lyonsouglas Elyzabeth Goatley, MD 04/23/16 [phone]

## 2016-04-23 NOTE — ED Notes (Signed)
C/o abscess to L anterior forearm x 1 week from using heroin.

## 2016-04-23 NOTE — Discharge Instructions (Signed)
Keflex and Bactrim as prescribed.  Apply warm compresses as frequently as possible for the next several days.  Return to the ER if symptoms significantly worsen or change.   Abscess An abscess is an infected area that contains a collection of pus and debris.It can occur in almost any part of the body. An abscess is also known as a furuncle or boil. CAUSES  An abscess occurs when tissue gets infected. This can occur from blockage of oil or sweat glands, infection of hair follicles, or a minor injury to the skin. As the body tries to fight the infection, pus collects in the area and creates pressure under the skin. This pressure causes pain. People with weakened immune systems have difficulty fighting infections and get certain abscesses more often.  SYMPTOMS Usually an abscess develops on the skin and becomes a painful mass that is red, warm, and tender. If the abscess forms under the skin, you may feel a moveable soft area under the skin. Some abscesses break open (rupture) on their own, but most will continue to get worse without care. The infection can spread deeper into the body and eventually into the bloodstream, causing you to feel ill.  DIAGNOSIS  Your caregiver will take your medical history and perform a physical exam. A sample of fluid may also be taken from the abscess to determine what is causing your infection. TREATMENT  Your caregiver may prescribe antibiotic medicines to fight the infection. However, taking antibiotics alone usually does not cure an abscess. Your caregiver may need to make a small cut (incision) in the abscess to drain the pus. In some cases, gauze is packed into the abscess to reduce pain and to continue draining the area. HOME CARE INSTRUCTIONS   Only take over-the-counter or prescription medicines for pain, discomfort, or fever as directed by your caregiver.  If you were prescribed antibiotics, take them as directed. Finish them even if you start to feel  better.  If gauze is used, follow your caregiver's directions for changing the gauze.  To avoid spreading the infection:  Keep your draining abscess covered with a bandage.  Wash your hands well.  Do not share personal care items, towels, or whirlpools with others.  Avoid skin contact with others.  Keep your skin and clothes clean around the abscess.  Keep all follow-up appointments as directed by your caregiver. SEEK MEDICAL CARE IF:   You have increased pain, swelling, redness, fluid drainage, or bleeding.  You have muscle aches, chills, or a general ill feeling.  You have a fever. MAKE SURE YOU:   Understand these instructions.  Will watch your condition.  Will get help right away if you are not doing well or get worse.   This information is not intended to replace advice given to you by your health care provider. Make sure you discuss any questions you have with your health care provider.   Document Released: 09/16/2005 Document Revised: 06/07/2012 Document Reviewed: 02/19/2012 Elsevier Interactive Patient Education [employer]2016 [employer] Inc[employer]

## 2017-01-05 ENCOUNTER — Ambulatory Visit (INDEPENDENT_AMBULATORY_CARE_PROVIDER_SITE_OTHER): Payer: Medicaid Other | Admitting: Family

## 2017-01-05 ENCOUNTER — Encounter: Payer: Self-pay | Admitting: Family

## 2017-01-05 VITALS — BP 123/71 | HR 112 | Temp 98.2°F | Ht 66.0 in | Wt 134.6 lb

## 2017-01-05 DIAGNOSIS — Z3490 Encounter for supervision of normal pregnancy, unspecified, unspecified trimester: Secondary | ICD-10-CM

## 2017-01-05 DIAGNOSIS — N912 Amenorrhea, unspecified: Secondary | ICD-10-CM

## 2017-01-05 DIAGNOSIS — F17200 Nicotine dependence, unspecified, uncomplicated: Secondary | ICD-10-CM

## 2017-01-05 LAB — PREGNANCY, URINE: PREG TEST UR: POSITIVE — AB

## 2017-01-05 NOTE — Patient Instructions (Signed)
First Trimester of Pregnancy  The first trimester of pregnancy is from week 1 until the end of week 12 (months 1 through 3). A week after a sperm fertilizes an egg, the egg will implant on the wall of the uterus. This embryo will begin to develop into a baby. Genes from you and your partner are forming the baby. The female genes determine whether the baby is a boy or a girl. At 6-8 weeks, the eyes and face are formed, and the heartbeat can be seen on ultrasound. At the end of 12 weeks, all the baby's organs are formed.   Now that you are pregnant, you will want to do everything you can to have a healthy baby. Two of the most important things are to get good prenatal care and to follow your health care provider's instructions. Prenatal care is all the medical care you receive before the baby's birth. This care will help prevent, find, and treat any problems during the pregnancy and childbirth.  BODY CHANGES  Your body goes through many changes during pregnancy. The changes vary from woman to woman.   · You may gain or lose a couple of pounds at first.  · You may feel sick to your stomach (nauseous) and throw up (vomit). If the vomiting is uncontrollable, call your health care provider.  · You may tire easily.  · You may develop headaches that can be relieved by medicines approved by your health care provider.  · You may urinate more often. Painful urination may mean you have a bladder infection.  · You may develop heartburn as a result of your pregnancy.  · You may develop constipation because certain hormones are causing the muscles that push waste through your intestines to slow down.  · You may develop hemorrhoids or swollen, bulging veins (varicose veins).  · Your breasts may begin to grow larger and become tender. Your nipples may stick out more, and the tissue that surrounds them (areola) may become darker.  · Your gums may bleed and may be sensitive to brushing and flossing.   · Dark spots or blotches (chloasma, mask of pregnancy) may develop on your face. This will likely fade after the baby is born.  · Your menstrual periods will stop.  · You may have a loss of appetite.  · You may develop cravings for certain kinds of food.  · You may have changes in your emotions from day to day, such as being excited to be pregnant or being concerned that something may go wrong with the pregnancy and baby.  · You may have more vivid and strange dreams.  · You may have changes in your hair. These can include thickening of your hair, rapid growth, and changes in texture. Some women also have hair loss during or after pregnancy, or hair that feels dry or thin. Your hair will most likely return to normal after your baby is born.  WHAT TO EXPECT AT YOUR PRENATAL VISITS  During a routine prenatal visit:  · You will be weighed to make sure you and the baby are growing normally.  · Your blood pressure will be taken.  · Your abdomen will be measured to track your baby's growth.  · The fetal heartbeat will be listened to starting around week 10 or 12 of your pregnancy.  · Test results from any previous visits will be discussed.  Your health care provider may ask you:  · How you are feeling.  · If you   are feeling the baby move.  · If you have had any abnormal symptoms, such as leaking fluid, bleeding, severe headaches, or abdominal cramping.  · If you are using any tobacco products, including cigarettes, chewing tobacco, and electronic cigarettes.  · If you have any questions.  Other tests that may be performed during your first trimester include:  · Blood tests to find your blood type and to check for the presence of any previous infections. They will also be used to check for low iron levels (anemia) and Rh antibodies. Later in the pregnancy, blood tests for diabetes will be done along with other tests if problems develop.  · Urine tests to check for infections, diabetes, or protein in the urine.   · An ultrasound to confirm the proper growth and development of the baby.  · An amniocentesis to check for possible genetic problems.  · Fetal screens for spina bifida and Down syndrome.  · You may need other tests to make sure you and the baby are doing well.  · HIV (human immunodeficiency virus) testing. Routine prenatal testing includes screening for HIV, unless you choose not to have this test.  HOME CARE INSTRUCTIONS   Medicines  · Follow your health care provider's instructions regarding medicine use. Specific medicines may be either safe or unsafe to take during pregnancy.  · Take your prenatal vitamins as directed.  · If you develop constipation, try taking a stool softener if your health care provider approves.  Diet  · Eat regular, well-balanced meals. Choose a variety of foods, such as meat or vegetable-based protein, fish, milk and low-fat dairy products, vegetables, fruits, and whole grain breads and cereals. Your health care provider will help you determine the amount of weight gain that is right for you.  · Avoid raw meat and uncooked cheese. These carry germs that can cause birth defects in the baby.  · Eating four or five small meals rather than three large meals a day may help relieve nausea and vomiting. If you start to feel nauseous, eating a few soda crackers can be helpful. Drinking liquids between meals instead of during meals also seems to help nausea and vomiting.  · If you develop constipation, eat more high-fiber foods, such as fresh vegetables or fruit and whole grains. Drink enough fluids to keep your urine clear or pale yellow.  Activity and Exercise  · Exercise only as directed by your health care provider. Exercising will help you:    Control your weight.    Stay in shape.    Be prepared for labor and delivery.  · Experiencing pain or cramping in the lower abdomen or low back is a good sign that you should stop exercising. Check with your health care provider  before continuing normal exercises.  · Try to avoid standing for long periods of time. Move your legs often if you must stand in one place for a long time.  · Avoid heavy lifting.  · Wear low-heeled shoes, and practice good posture.  · You may continue to have sex unless your health care provider directs you otherwise.  Relief of Pain or Discomfort  · Wear a good support bra for breast tenderness.    · Take warm sitz baths to soothe any pain or discomfort caused by hemorrhoids. Use hemorrhoid cream if your health care provider approves.    · Rest with your legs elevated if you have leg cramps or low back pain.  · If you develop varicose veins in your   legs, wear support hose. Elevate your feet for 15 minutes, 3-4 times a day. Limit salt in your diet.  Prenatal Care  · Schedule your prenatal visits by the twelfth week of pregnancy. They are usually scheduled monthly at first, then more often in the last 2 months before delivery.  · Write down your questions. Take them to your prenatal visits.  · Keep all your prenatal visits as directed by your health care provider.  Safety  · Wear your seat belt at all times when driving.  · Make a list of emergency phone numbers, including numbers for family, friends, the hospital, and police and fire departments.  General Tips  · Ask your health care provider for a referral to a local prenatal education class. Begin classes no later than at the beginning of month 6 of your pregnancy.  · Ask for help if you have counseling or nutritional needs during pregnancy. Your health care provider can offer advice or refer you to specialists for help with various needs.  · Do not use hot tubs, steam rooms, or saunas.  · Do not douche or use tampons or scented sanitary pads.  · Do not cross your legs for long periods of time.  · Avoid cat litter boxes and soil used by cats. These carry germs that can cause birth defects in the baby and possibly loss of the fetus by miscarriage or stillbirth.   · Avoid all smoking, herbs, alcohol, and medicines not prescribed by your health care provider. Chemicals in these affect the formation and growth of the baby.  · Do not use any tobacco products, including cigarettes, chewing tobacco, and electronic cigarettes. If you need help quitting, ask your health care provider. You may receive counseling support and other resources to help you quit.  · Schedule a dentist appointment. At home, brush your teeth with a soft toothbrush and be gentle when you floss.  SEEK MEDICAL CARE IF:   · You have dizziness.  · You have mild pelvic cramps, pelvic pressure, or nagging pain in the abdominal area.  · You have persistent nausea, vomiting, or diarrhea.  · You have a bad smelling vaginal discharge.  · You have pain with urination.  · You notice increased swelling in your face, hands, legs, or ankles.  SEEK IMMEDIATE MEDICAL CARE IF:   · You have a fever.  · You are leaking fluid from your vagina.  · You have spotting or bleeding from your vagina.  · You have severe abdominal cramping or pain.  · You have rapid weight gain or loss.  · You vomit blood or material that looks like coffee grounds.  · You are exposed to German measles and have never had them.  · You are exposed to fifth disease or chickenpox.  · You develop a severe headache.  · You have shortness of breath.  · You have any kind of trauma, such as from a fall or a car accident.     This information is not intended to replace advice given to you by your health care provider. Make sure you discuss any questions you have with your health care provider.     Document Released: 12/01/2001 Document Revised: 12/28/2014 Document Reviewed: 10/17/2013  Elsevier Interactive Patient Education ©2017 Else[employer] Inc.

## 2017-01-05 NOTE — Progress Notes (Signed)
   Subjective:    Patient ID: [name], female    DOB: Mar 21, 1986, 31 y.o.   MRN: [id number]  HPI Pt presents to the office today to establish care. Pt states she is pregnant and needs a referral to OB/GYN. Pt states her last menstrual cycle was in Sept 23/24. Pt is currently not taking any medications at this time. Pt states she is having a lot of nausea and vomiting.    Review of Systems  All other systems reviewed and are negative.      Objective:   Physical Exam  Constitutional: She is oriented to person, place, and time. She appears well-developed and well-nourished. No distress.  HENT:  Head: Normocephalic and atraumatic.  Right Ear: External ear normal.  Left Ear: External ear normal.  Nose: Mucosal edema and rhinorrhea present.  Mouth/Throat: Oropharynx is clear and moist.  Eyes: Pupils are equal, round, and reactive to light.  Neck: Normal range of motion. Neck supple. No thyromegaly present.  Cardiovascular: Normal rate, regular rhythm, normal heart sounds and intact distal pulses.   No murmur heard. Pulmonary/Chest: Effort normal and breath sounds normal. No respiratory distress. She has no wheezes.  Abdominal: Soft. Bowel sounds are normal. She exhibits no distension. There is no tenderness.  Musculoskeletal: Normal range of motion. She exhibits no edema or tenderness.  Neurological: She is alert and oriented to person, place, and time. She has normal reflexes. No cranial nerve deficit.  Skin: Skin is warm and dry.  Psychiatric: She has a normal mood and affect. Her behavior is normal. Judgment and thought content normal.  Vitals reviewed.     BP 123/71   Pulse (!) 112   Temp 98.2 F (36.8 C) (Oral)   Ht 5\' 6"  (1.676 m)   Wt 134 lb 9.6 oz (61.1 kg)   BMI 21.73 kg/m      Assessment & Plan:  1. Absence of menstruation - Pregnancy, urine - Ambulatory referral to Obstetrics / Gynecology  2. Pregnancy, unspecified gestational age -Start prenatal  vitamins  -Avoid OTC medications, alcohol  -Smoking cessation dsicussed - Ambulatory referral to Obstetrics / Gynecology  3. Current smoker Smoking cessation discussed - Ambulatory referral to Obstetrics / Gynecology   Continue all meds Labs pending Health Maintenance reviewed Diet and exercise encouraged RTO as needed  [name], FNP

## 2017-01-29 DIAGNOSIS — I808 Phlebitis and thrombophlebitis of other sites: Secondary | ICD-10-CM | POA: Insufficient documentation

## 2017-02-05 ENCOUNTER — Encounter: Payer: Medicaid Other | Admitting: Advanced Practice Midwife

## 2017-02-08 ENCOUNTER — Encounter: Payer: Self-pay | Admitting: Family

## 2017-02-09 ENCOUNTER — Encounter: Payer: Self-pay | Admitting: Family

## 2017-02-09 ENCOUNTER — Telehealth: Payer: Self-pay | Admitting: Family

## 2017-02-09 NOTE — Telephone Encounter (Signed)
Left message.  Please call [facility] if you need to reschedule.

## 2017-06-04 DIAGNOSIS — O0933 Supervision of pregnancy with insufficient antenatal care, third trimester: Secondary | ICD-10-CM | POA: Insufficient documentation

## 2017-06-04 DIAGNOSIS — O99320 Drug use complicating pregnancy, unspecified trimester: Secondary | ICD-10-CM

## 2017-06-04 DIAGNOSIS — R768 Other specified abnormal immunological findings in serum: Secondary | ICD-10-CM | POA: Insufficient documentation

## 2017-06-04 DIAGNOSIS — O09893 Supervision of other high risk pregnancies, third trimester: Secondary | ICD-10-CM | POA: Insufficient documentation

## 2017-06-04 DIAGNOSIS — O0993 Supervision of high risk pregnancy, unspecified, third trimester: Secondary | ICD-10-CM | POA: Insufficient documentation

## 2017-06-05 DIAGNOSIS — F1190 Opioid use, unspecified, uncomplicated: Secondary | ICD-10-CM | POA: Insufficient documentation

## 2017-06-05 DIAGNOSIS — O99320 Drug use complicating pregnancy, unspecified trimester: Secondary | ICD-10-CM | POA: Insufficient documentation

## 2017-06-07 DIAGNOSIS — F1120 Opioid dependence, uncomplicated: Secondary | ICD-10-CM | POA: Insufficient documentation

## 2017-06-07 DIAGNOSIS — O98213 Gonorrhea complicating pregnancy, third trimester: Secondary | ICD-10-CM | POA: Insufficient documentation

## 2017-06-08 DIAGNOSIS — O99820 Streptococcus B carrier state complicating pregnancy: Secondary | ICD-10-CM | POA: Insufficient documentation

## 2017-08-04 ENCOUNTER — Encounter: Payer: Self-pay | Admitting: Family Medicine

## 2017-08-04 ENCOUNTER — Ambulatory Visit (INDEPENDENT_AMBULATORY_CARE_PROVIDER_SITE_OTHER): Payer: Medicaid Other | Admitting: Family Medicine

## 2017-08-04 VITALS — BP 120/81 | HR 111 | Temp 98.0°F | Ht 66.0 in | Wt 131.0 lb

## 2017-08-04 DIAGNOSIS — F321 Major depressive disorder, single episode, moderate: Secondary | ICD-10-CM

## 2017-08-04 DIAGNOSIS — F1120 Opioid dependence, uncomplicated: Secondary | ICD-10-CM

## 2017-08-04 MED ORDER — SERTRALINE HCL 50 MG PO TABS: 50.0000 mg | ORAL_TABLET | Freq: Every day | ORAL | 1 refills | Status: DC

## 2017-08-05 NOTE — Progress Notes (Signed)
Subjective:  Patient ID: Pamela Zimmerman, female    DOB: 1986/03/22  Age: 31 y.o. MRN: [id number]  CC: Chest Pain (pt here today c/o one episode this past weekend where she had a pain in her chest that went to her ears and lasted for about 10 minutes. She is almost 6 weeks PP and hasn't went for her PP visit at her OB. She states she hasn't been sleeping a lot and is under a lot of stress with her living situation and her boyfriend. )   HPI Pamela Zimmerman presents for 6 weeks postpartum. Patient is free of chest pain at this time. The pain was right-sided and went to her ears. She's had no recurrence. Patient states she's not sleeping well. She is withdrawn lacking interest in activities. She also states that she has been addicted to heroin. Her baby's father helped her become addicted. She has broken up with him and moved in with her friend. She has just been with a friend for a couple of days she feels will be supportive. She has started using Suboxone and tapering off heroin under supervision of an addictionologist. Her baby is in the care of [facility]. She is hoping to get baby back once she eats the addiction.  Depression screen [facility] 2/9 08/04/2017 01/05/2017  Decreased Interest 1 0  Down, Depressed, Hopeless 1 0  PHQ - 2 Score 2 0  Altered sleeping 2 -  Tired, decreased energy 2 -  Change in appetite 1 -  Feeling bad or failure about yourself  1 -  Trouble concentrating 1 -  Moving slowly or fidgety/restless 0 -  Suicidal thoughts 0 -  PHQ-9 Score 9 -    History Pamela Zimmerman has a past medical history of Depression; Drug abuse; and Heroin abuse.   She has a past surgical history that includes Right hand; Cholecystectomy; Hand surgery (Left); and Leg Surgery (Left).   Her family history includes Cancer in her mother; Cystic fibrosis in her other; Deafness in her brother; Diabetes in her father; Stroke in her father.She reports that she has been smoking Cigarettes.  She has been smoking about  0.50 packs per day. She has never used smokeless tobacco. She reports that she uses drugs, including Benzodiazepines and IV. She reports that she does not drink alcohol.    ROS Review of Systems  Constitutional: Negative for activity change, appetite change and fever.  HENT: Negative for congestion, rhinorrhea and sore throat.   Eyes: Negative for visual disturbance.  Respiratory: Negative for cough and shortness of breath.   Cardiovascular: Negative for chest pain and palpitations.  Gastrointestinal: Negative for abdominal pain, diarrhea and nausea.  Genitourinary: Negative for dysuria.  Musculoskeletal: Negative for arthralgias and myalgias.  Psychiatric/Behavioral: Positive for behavioral problems and dysphoric mood. The patient is nervous/anxious.     Objective:  BP 120/81   Pulse (!) 111   Temp 98 F (36.7 C) (Oral)   Ht 5\' 6"  (1.676 m)   Wt 131 lb (59.4 kg)   Breastfeeding? No   BMI 21.14 kg/m   BP Readings from Last 3 Encounters:  08/04/17 120/81  01/05/17 123/71  04/23/16 118/79    Wt Readings from Last 3 Encounters:  08/04/17 131 lb (59.4 kg)  01/05/17 134 lb 9.6 oz (61.1 kg)  04/22/16 130 lb (59 kg)     Physical Exam  Constitutional: She is oriented to person, place, and time. She appears well-developed and well-nourished. No distress.  HENT:  Head: Normocephalic  and atraumatic.  Right Ear: External ear normal.  Left Ear: External ear normal.  Nose: Nose normal.  Mouth/Throat: Oropharynx is clear and moist.  Eyes: Pupils are equal, round, and reactive to light. Conjunctivae and EOM are normal.  Neck: Normal range of motion. Neck supple. No thyromegaly present.  Cardiovascular: Normal rate, regular rhythm and normal heart sounds.   No murmur heard. Pulmonary/Chest: Effort normal and breath sounds normal. No respiratory distress. She has no wheezes. She has no rales.  Abdominal: Soft. Bowel sounds are normal. She exhibits no distension. There is no  tenderness.  Lymphadenopathy:    She has no cervical adenopathy.  Neurological: She is alert and oriented to person, place, and time. She has normal reflexes.  Skin: Skin is warm and dry.  Psychiatric: Thought content normal. Her affect is blunt. Her speech is delayed. She is slowed. Cognition and memory are normal. She exhibits a depressed mood.      Assessment & Plan:   Pamela JoinerRebecca was seen today for chest pain.  Diagnoses and all orders for this visit:  Depression, major, single episode, moderate (HCC)  Heroin addiction (HCC)  Other orders -     sertraline (ZOLOFT) 50 MG tablet; Take 1 tablet (50 mg total) by mouth daily.       I am having Pamela Zimmerman start on sertraline. I am also having her maintain her Buprenorphine HCl-Naloxone HCl.  Allergies as of 08/04/2017   No Known Allergies     Medication List       Accurate as of 08/04/17 11:59 PM. Always use your most recent med list.          Buprenorphine HCl-Naloxone HCl 8-2 MG Film Place under the tongue.   sertraline 50 MG tablet Commonly known as:  ZOLOFT Take 1 tablet (50 mg total) by mouth daily.        Follow-up: No Follow-up on file.  Mechele ClaudeWarren Latana Colin, M.D.

## 2017-08-17 ENCOUNTER — Ambulatory Visit: Payer: Medicaid Other | Admitting: Family Medicine

## 2017-08-18 ENCOUNTER — Encounter: Payer: Self-pay | Admitting: Family Medicine

## 2017-08-18 ENCOUNTER — Telehealth: Payer: Self-pay | Admitting: Family Medicine

## 2017-09-10 ENCOUNTER — Ambulatory Visit (INDEPENDENT_AMBULATORY_CARE_PROVIDER_SITE_OTHER): Payer: Medicaid Other | Admitting: Nurse Practitioner

## 2017-09-10 ENCOUNTER — Encounter: Payer: Self-pay | Admitting: Nurse Practitioner

## 2017-09-10 VITALS — BP 117/76 | HR 92 | Temp 97.8°F | Ht 66.0 in | Wt 131.0 lb

## 2017-09-10 DIAGNOSIS — N912 Amenorrhea, unspecified: Secondary | ICD-10-CM | POA: Diagnosis not present

## 2017-09-10 DIAGNOSIS — B9689 Other specified bacterial agents as the cause of diseases classified elsewhere: Secondary | ICD-10-CM | POA: Diagnosis not present

## 2017-09-10 DIAGNOSIS — N760 Acute vaginitis: Secondary | ICD-10-CM

## 2017-09-10 DIAGNOSIS — Z202 Contact with and (suspected) exposure to infections with a predominantly sexual mode of transmission: Secondary | ICD-10-CM | POA: Diagnosis not present

## 2017-09-10 DIAGNOSIS — N898 Other specified noninflammatory disorders of vagina: Secondary | ICD-10-CM

## 2017-09-10 LAB — WET PREP FOR TRICH, YEAST, CLUE
CLUE CELL EXAM: POSITIVE — AB
Trichomonas Exam: NEGATIVE
YEAST EXAM: NEGATIVE

## 2017-09-10 LAB — PREGNANCY, URINE: Preg Test, Ur: NEGATIVE

## 2017-09-10 MED ORDER — METRONIDAZOLE 500 MG PO TABS: 500.0000 mg | ORAL_TABLET | Freq: Two times a day (BID) | ORAL | 0 refills | Status: DC

## 2017-09-10 MED ORDER — AZITHROMYCIN 500 MG PO TABS: 1000.0000 mg | ORAL_TABLET | Freq: Every day | ORAL | 0 refills | Status: DC

## 2017-09-10 MED ORDER — LEVONORGESTREL-ETHINYL ESTRAD 0.1-20 MG-MCG PO TABS: 1.0000 | ORAL_TABLET | Freq: Every day | ORAL | 11 refills | Status: DC

## 2017-09-10 NOTE — Progress Notes (Signed)
   Subjective:    Patient ID: Pamela Zimmerman, female    DOB: 25-Jul-1986, 31 y.o.   MRN: [id number]  HPI Patient comes in today c/o possible STD. SHe just got out of a 5 year relationship. She has had a vaginal discharge with a foul odor. partenr said she may have gonorrhea.She also is late on her period. She just had a baby in July and has not had a period since then.    Review of Systems  Constitutional: Negative.   Respiratory: Negative.   Cardiovascular: Negative.   Genitourinary: Positive for dysuria, frequency, urgency and vaginal discharge.  Neurological: Negative.   Psychiatric/Behavioral: Negative.   All other systems reviewed and are negative.      Objective:   Physical Exam  Constitutional: She is oriented to person, place, and time. She appears well-developed and well-nourished. No distress.  Cardiovascular: Normal rate and regular rhythm.   Pulmonary/Chest: Effort normal and breath sounds normal.  Genitourinary:  Genitourinary Comments: Self wet prep  Neurological: She is alert and oriented to person, place, and time.  Skin: Skin is warm.  Psychiatric: She has a normal mood and affect. Her behavior is normal. Judgment and thought content normal.   BP 117/76   Pulse 92   Temp 97.8 F (36.6 C) (Oral)   Ht  (1.676 m)   Wt 131 lb (59.4 kg)   BMI 21.14 kg/m   preg test negative Wet prep- clue cells and lots of bacteria     Assessment & Plan:   1. Vaginal discharge   2. Possible exposure to STD   3. Amenorrhea   4. Bacterial vaginosis    Meds ordered this encounter  Medications  . azithromycin (ZITHROMAX) 500 MG tablet    Sig: Take 2 tablets (1,000 mg total) by mouth daily.    Dispense:  2 tablet    Refill:  0    Order Specific Question:   Supervising Provider    Answer:   VINCENT, CAROL L [[id number]]  . metroNIDAZOLE (FLAGYL) 500 MG tablet    Sig: Take 1 tablet (500 mg total) by mouth 2 (two) times daily.    Dispense:  14 tablet    Refill:  0   Order Specific Question:   Supervising Provider    Answer:   VINCENT, CAROL L [[id number]]  . levonorgestrel-ethinyl estradiol (AVIANE) 0.1-20 MG-MCG tablet    Sig: Take 1 tablet by mouth daily.    Dispense:  1 Package    Refill:  11    Order Specific Question:   Supervising Provider    Answer:   Rex Kras L [[id number]]    Take azithromycin today Start flagyl tomorrow Start birth control sunda Safe sex encouraged Follow up prn  Mary-Margaret Daphine Deutscher, FNP

## 2017-09-10 NOTE — Patient Instructions (Signed)

## 2017-09-14 LAB — GC/CHLAMYDIA PROBE AMP
Chlamydia trachomatis, NAA: NEGATIVE
NEISSERIA GONORRHOEAE BY PCR: NEGATIVE

## 2018-05-03 ENCOUNTER — Emergency Department (HOSPITAL_COMMUNITY)
Admission: EM | Admit: 2018-05-03 | Discharge: 2018-05-03 | Disposition: A | Discharge status: Home / Self Care | Payer: Self-pay | Attending: Emergency Medicine | Admitting: Emergency Medicine

## 2018-05-03 ENCOUNTER — Other Ambulatory Visit: Payer: Self-pay

## 2018-05-03 ENCOUNTER — Encounter (HOSPITAL_COMMUNITY): Payer: Self-pay | Admitting: Emergency Medicine

## 2018-05-03 DIAGNOSIS — F17210 Nicotine dependence, cigarettes, uncomplicated: Secondary | ICD-10-CM | POA: Insufficient documentation

## 2018-05-03 DIAGNOSIS — F1110 Opioid abuse, uncomplicated: Secondary | ICD-10-CM | POA: Insufficient documentation

## 2018-05-03 LAB — CBC WITH DIFFERENTIAL/PLATELET
Basophils Absolute: 0 K/uL (ref 0.0–0.1)
Basophils Relative: 0 %
Eosinophils Absolute: 0.2 K/uL (ref 0.0–0.7)
Eosinophils Relative: 3 %
HCT: 44.2 % (ref 36.0–46.0)
Hemoglobin: 14.4 g/dL (ref 12.0–15.0)
Lymphocytes Relative: 34 %
Lymphs Abs: 2.6 K/uL (ref 0.7–4.0)
MCH: 28.4 pg (ref 26.0–34.0)
MCHC: 32.6 g/dL (ref 30.0–36.0)
MCV: 87.2 fL (ref 78.0–100.0)
Monocytes Absolute: 0.7 K/uL (ref 0.1–1.0)
Monocytes Relative: 10 %
Neutro Abs: 4.1 K/uL (ref 1.7–7.7)
Neutrophils Relative %: 53 %
Platelets: 317 K/uL (ref 150–400)
RBC: 5.07 MIL/uL (ref 3.87–5.11)
RDW: 14.5 % (ref 11.5–15.5)
WBC: 7.7 K/uL (ref 4.0–10.5)

## 2018-05-03 LAB — BASIC METABOLIC PANEL WITH GFR
Anion gap: 7 (ref 5–15)
BUN: 13 mg/dL (ref 6–20)
CO2: 31 mmol/L (ref 22–32)
Calcium: 9.1 mg/dL (ref 8.9–10.3)
Chloride: 102 mmol/L (ref 101–111)
Creatinine, Ser: 0.93 mg/dL (ref 0.44–1.00)
GFR calc Af Amer: >60 mL/min
GFR calc non Af Amer: >60 mL/min
Glucose, Bld: 69 mg/dL (ref 65–99)
Potassium: 4.7 mmol/L (ref 3.5–5.1)
Sodium: 140 mmol/L (ref 135–145)

## 2018-05-03 LAB — ETHANOL

## 2018-05-03 MED ORDER — ONDANSETRON 8 MG PO TBDP: 8.0000 mg | ORAL_TABLET | Freq: Three times a day (TID) | ORAL | 0 refills | Status: DC | PRN

## 2018-05-03 MED ORDER — CYCLOBENZAPRINE HCL 10 MG PO TABS: 10.0000 mg | ORAL_TABLET | Freq: Two times a day (BID) | ORAL | 0 refills | Status: DC | PRN

## 2018-05-03 MED ORDER — NAPROXEN 500 MG PO TABS: 500.0000 mg | ORAL_TABLET | Freq: Two times a day (BID) | ORAL | 0 refills | Status: DC

## 2018-05-03 NOTE — ED Triage Notes (Signed)
Pt states she wants help getting off heroin. She states she has used 2 hours ago. Pt denies any si/hi ideations at this time.

## 2018-05-03 NOTE — ED Provider Notes (Signed)
[facility] EMERGENCY DEPARTMENT Provider Note   CSN: [id number] Arrival date & time: 05/03/18  1847     History   Chief Complaint Chief Complaint  Patient presents with  . Medical Clearance    HPI Pamela Zimmerman is a 32 y.o. female.  HPI Patient presents to the emergency room for assistance with heroin abuse.  Patient has been injecting heroin.  She last used 2 hours prior to arrival.  Patient wants to get some help to get off heroin.  She called an outpatient treatment facility and they did not have any availability.  They instructed to come to the hospital patient denies any trouble with fevers or chills.  No suicidal homicidal ideation. Past Medical History:  Diagnosis Date  . Depression    had post partum depressin with last delivery  . Drug abuse (HCC)   . Heroin abuse [facility])     Patient Active Problem List   Diagnosis Date Noted  . Current smoker 01/05/2017  . Supervision of other normal pregnancy 03/17/2013  . Depression 03/02/2013    Past Surgical History:  Procedure Laterality Date  . CHOLECYSTECTOMY    . HAND SURGERY Left   . LEG SURGERY Left    Growth was removed.  . Right hand       OB History    Gravida  2   Para  2   Term  1   Preterm  1   AB      Living  2     SAB      TAB      Ectopic      Multiple      Live Births  2            Home Medications    Prior to Admission medications   Medication Sig Start Date End Date Taking? Authorizing Provider  cyclobenzaprine (FLEXERIL) 10 MG tablet Take 1 tablet (10 mg total) by mouth 2 (two) times daily as needed for muscle spasms. 05/03/18   Linwood Dibbles, MD  naproxen (NAPROSYN) 500 MG tablet Take 1 tablet (500 mg total) by mouth 2 (two) times daily. 05/03/18   Linwood Dibbles, MD  ondansetron (ZOFRAN ODT) 8 MG disintegrating tablet Take 1 tablet (8 mg total) by mouth every 8 (eight) hours as needed for nausea or vomiting. 05/03/18   Linwood Dibbles, MD    Family History Family History    Problem Relation Age of Onset  . Cancer Mother        lung  . Diabetes Father   . Stroke Father   . Deafness Brother   . Cystic fibrosis Other     Social History Social History   Tobacco Use  . Smoking status: Current Every Day Smoker    Packs/day: 0.50    Types: Cigarettes  . Smokeless tobacco: Never Used  Substance Use Topics  . Alcohol use: No  . Drug use: Yes    Types: Benzodiazepines, IV    Comment: heroin     Allergies   Patient has no known allergies.   Review of Systems Review of Systems  All other systems reviewed and are negative.    Physical Exam Updated Vital Signs BP 117/64   Pulse 97   Temp 98.2 F (36.8 C)   Resp 18   Ht 1.676 m ( )   Wt 58.1 kg (128 lb)   LMP 04/25/2018   SpO2 98%   BMI 20.66 kg/m   Physical Exam  Constitutional:  She appears well-developed and well-nourished. No distress.  HENT:  Head: Normocephalic and atraumatic.  Right Ear: External ear normal.  Left Ear: External ear normal.  Eyes: Conjunctivae are normal. Right eye exhibits no discharge. Left eye exhibits no discharge. No scleral icterus.  Neck: Neck supple. No tracheal deviation present.  Cardiovascular: Normal rate, regular rhythm and intact distal pulses.  Pulmonary/Chest: Effort normal and breath sounds normal. No stridor. No respiratory distress. She has no wheezes. She has no rales.  Abdominal: Soft. Bowel sounds are normal. She exhibits no distension. There is no tenderness. There is no rebound and no guarding.  Musculoskeletal: She exhibits no edema or tenderness.  Neurological: She is alert. She has normal strength. No cranial nerve deficit (no facial droop, extraocular movements intact, no slurred speech) or sensory deficit. She exhibits normal muscle tone. She displays no seizure activity. Coordination normal.  Skin: Skin is warm and dry. No rash noted.  IV track marks noted in the left hand  Psychiatric: She has a normal mood and affect.  Nursing  note and vitals reviewed.    ED Treatments / Results  Labs (all labs ordered are listed, but only abnormal results are displayed) Labs Reviewed  CBC WITH DIFFERENTIAL/PLATELET  BASIC METABOLIC PANEL  ETHANOL  HIV ANTIBODY (ROUTINE TESTING)     Procedures Procedures (including critical care time)  Medications Ordered in ED Medications - No data to display   Initial Impression / Assessment and Plan / ED Course  I have reviewed the triage vital signs and the nursing notes.  Pertinent labs & imaging results that were available during my care of the patient were reviewed by me and considered in my medical decision making (see chart for details).  Clinical Course as of May 03 2144  Tue May 03, 2018  2125 Offered HIV test considering her IVDA.  Pt agrees.   [JK]  2144 Laboratory tests are reassuring.  Urine samples are pending, patient denies any symptoms.  I will not make her wait for those tests.   [JK]    Clinical Course User Index [JK] Linwood Dibbles, MD    Patient presents to the emergency room with complaints of heroin addiction.  Patient appears stable.  No signs of acute infection.  Patient is not depressed.  No indications for inpatient psychiatric treatment.  Patient will be discharged home with outpatient resources.  I did also place a consult order for peer support.  Final Clinical Impressions(s) / ED Diagnoses   Final diagnoses:  Heroin abuse [facility])    ED Discharge Orders        Ordered    naproxen (NAPROSYN) 500 MG tablet  2 times daily     05/03/18 2144    ondansetron (ZOFRAN ODT) 8 MG disintegrating tablet  Every 8 hours PRN     05/03/18 2144    cyclobenzaprine (FLEXERIL) 10 MG tablet  2 times daily PRN     05/03/18 2144       Linwood Dibbles, MD 05/03/18 2146

## 2018-05-03 NOTE — ED Notes (Signed)
Pt left before rcving discharge papers or signing

## 2018-05-03 NOTE — Discharge Instructions (Signed)
You can take the medications to help with some of the symptoms associated with opiate withdrawal.  Follow up with the outpatient treatment centers in the guide.

## 2018-05-04 ENCOUNTER — Encounter: Payer: Self-pay | Admitting: Family Medicine

## 2018-05-04 DIAGNOSIS — F1110 Opioid abuse, uncomplicated: Secondary | ICD-10-CM | POA: Insufficient documentation

## 2018-05-05 LAB — HIV ANTIBODY (ROUTINE TESTING W REFLEX): HIV SCREEN 4TH GENERATION: NONREACTIVE

## 2018-05-06 ENCOUNTER — Telehealth (HOSPITAL_COMMUNITY): Payer: Self-pay

## 2019-01-07 ENCOUNTER — Emergency Department (HOSPITAL_COMMUNITY): Payer: Self-pay

## 2019-01-07 ENCOUNTER — Inpatient Hospital Stay (HOSPITAL_COMMUNITY)
Admission: EM | Admit: 2019-01-07 | Discharge: 2019-01-08 | DRG: 603 | Discharge status: Against Medical Advice | Payer: Self-pay | Attending: Family Medicine | Admitting: Family Medicine

## 2019-01-07 ENCOUNTER — Other Ambulatory Visit: Payer: Self-pay

## 2019-01-07 ENCOUNTER — Encounter (HOSPITAL_COMMUNITY): Payer: Self-pay | Admitting: Emergency Medicine

## 2019-01-07 ENCOUNTER — Observation Stay (HOSPITAL_COMMUNITY): Payer: Self-pay

## 2019-01-07 DIAGNOSIS — L0390 Cellulitis, unspecified: Secondary | ICD-10-CM

## 2019-01-07 DIAGNOSIS — Z9049 Acquired absence of other specified parts of digestive tract: Secondary | ICD-10-CM

## 2019-01-07 DIAGNOSIS — Z3490 Encounter for supervision of normal pregnancy, unspecified, unspecified trimester: Secondary | ICD-10-CM

## 2019-01-07 DIAGNOSIS — Z823 Family history of stroke: Secondary | ICD-10-CM

## 2019-01-07 DIAGNOSIS — F1110 Opioid abuse, uncomplicated: Secondary | ICD-10-CM | POA: Diagnosis present

## 2019-01-07 DIAGNOSIS — S61431A Puncture wound without foreign body of right hand, initial encounter: Secondary | ICD-10-CM | POA: Diagnosis present

## 2019-01-07 DIAGNOSIS — L089 Local infection of the skin and subcutaneous tissue, unspecified: Secondary | ICD-10-CM | POA: Diagnosis present

## 2019-01-07 DIAGNOSIS — Z331 Pregnant state, incidental: Secondary | ICD-10-CM | POA: Diagnosis present

## 2019-01-07 DIAGNOSIS — L03113 Cellulitis of right upper limb: Principal | ICD-10-CM | POA: Diagnosis present

## 2019-01-07 DIAGNOSIS — O99320 Drug use complicating pregnancy, unspecified trimester: Secondary | ICD-10-CM

## 2019-01-07 DIAGNOSIS — L02511 Cutaneous abscess of right hand: Secondary | ICD-10-CM | POA: Diagnosis present

## 2019-01-07 DIAGNOSIS — F1910 Other psychoactive substance abuse, uncomplicated: Secondary | ICD-10-CM

## 2019-01-07 DIAGNOSIS — D649 Anemia, unspecified: Secondary | ICD-10-CM | POA: Diagnosis present

## 2019-01-07 DIAGNOSIS — F17210 Nicotine dependence, cigarettes, uncomplicated: Secondary | ICD-10-CM | POA: Diagnosis present

## 2019-01-07 DIAGNOSIS — Z833 Family history of diabetes mellitus: Secondary | ICD-10-CM

## 2019-01-07 DIAGNOSIS — S6991XA Unspecified injury of right wrist, hand and finger(s), initial encounter: Secondary | ICD-10-CM

## 2019-01-07 LAB — CBC
HCT: 35.9 % — ABNORMAL LOW (ref 36.0–46.0)
Hemoglobin: 11.3 g/dL — ABNORMAL LOW (ref 12.0–15.0)
MCH: 25.6 pg — AB (ref 26.0–34.0)
MCHC: 31.5 g/dL (ref 30.0–36.0)
MCV: 81.2 fL (ref 80.0–100.0)
NRBC: 0 % (ref 0.0–0.2)
PLATELETS: 392 10*3/uL (ref 150–400)
RBC: 4.42 MIL/uL (ref 3.87–5.11)
RDW: 15 % (ref 11.5–15.5)
WBC: 8.8 10*3/uL (ref 4.0–10.5)

## 2019-01-07 LAB — COMPREHENSIVE METABOLIC PANEL
ALT: 28 U/L (ref 0–44)
AST: 30 U/L (ref 15–41)
Albumin: 2.8 g/dL — ABNORMAL LOW (ref 3.5–5.0)
Alkaline Phosphatase: 58 U/L (ref 38–126)
Anion gap: 8 (ref 5–15)
BUN: 8 mg/dL (ref 6–20)
CALCIUM: 8.3 mg/dL — AB (ref 8.9–10.3)
CHLORIDE: 104 mmol/L (ref 98–111)
CO2: 22 mmol/L (ref 22–32)
CREATININE: 0.68 mg/dL (ref 0.44–1.00)
Glucose, Bld: 99 mg/dL (ref 70–99)
POTASSIUM: 4.4 mmol/L (ref 3.5–5.1)
SODIUM: 134 mmol/L — AB (ref 135–145)
TOTAL PROTEIN: 6.3 g/dL — AB (ref 6.5–8.1)
Total Bilirubin: 0.6 mg/dL (ref 0.3–1.2)

## 2019-01-07 LAB — I-STAT BETA HCG BLOOD, ED (MC, WL, AP ONLY): I-stat hCG, quantitative: 170.4 m[IU]/mL — ABNORMAL HIGH (ref <5)

## 2019-01-07 LAB — HCG, SERUM, QUALITATIVE: Preg, Serum: POSITIVE — AB

## 2019-01-07 MED ORDER — SODIUM CHLORIDE 0.9 % IV BOLUS
1000.0000 mL | Freq: Once | INTRAVENOUS | Status: AC
  Administered 2019-01-07: 1000 mL via INTRAVENOUS

## 2019-01-07 MED ORDER — VANCOMYCIN HCL IN DEXTROSE 1-5 GM/200ML-% IV SOLN
1000.0000 mg | Freq: Once | INTRAVENOUS | Status: AC
  Administered 2019-01-07: 1000 mg via INTRAVENOUS
  Filled 2019-01-07: qty 200

## 2019-01-07 MED ORDER — CEFTRIAXONE SODIUM 2 G IJ SOLR
2.0000 g | Freq: Once | INTRAMUSCULAR | Status: AC
  Administered 2019-01-07: 2 g via INTRAVENOUS
  Filled 2019-01-07: qty 20

## 2019-01-07 MED ORDER — IOHEXOL 300 MG/ML  SOLN
100.0000 mL | Freq: Once | INTRAMUSCULAR | Status: AC | PRN
  Administered 2019-01-07: 100 mL via INTRAVENOUS

## 2019-01-07 MED ORDER — ACETAMINOPHEN 325 MG PO TABS: 650.0000 mg | ORAL_TABLET | Freq: Four times a day (QID) | ORAL | Status: DC | PRN

## 2019-01-07 NOTE — ED Triage Notes (Signed)
Patient arrived to ED with c/o right hand pain. She states that she was cleaning and was accidentally punctured by a hyperdermic needle that was laying around 4 days ago. Hand is visibly swollen and hot to touch.

## 2019-01-07 NOTE — ED Notes (Signed)
IV team at bedside 

## 2019-01-07 NOTE — ED Notes (Signed)
Patient back from Xray, ambulated to bathroom.

## 2019-01-07 NOTE — ED Provider Notes (Signed)
[facility] Emergency Department Provider Note MRN:  [id number]  Arrival date & time: 01/07/19     Chief Complaint   Hand Injury   History of Present Illness   [name] is a 33 y.o. year-old female with a history of IV drug use presenting to the ED with chief complaint of hand injury.  Patient uses IV heroin, last used 2 days ago.  Patient explains that 4 days ago she was reaching into a bag and accidentally stabbed herself with 1 of her friends used needles.  The needle inserted into the back of her right hand, estimated 1 to 2 inches deep.  Since that time is been experiencing worsening pain and redness to the right hand.  Pain is moderate, worse with motion, worse with palpation.  Denies fevers, no other symptoms, no chest pain, shortness of breath.  Review of Systems  A complete 10 system review of systems was obtained and all systems are negative except as noted in the HPI and PMH.   Patient's Health History    Past Medical History:  Diagnosis Date  . Depression    had post partum depressin with last delivery  . Drug abuse (HCC)   . Heroin abuse [facility](HCC)     Past Surgical History:  Procedure Laterality Date  . CHOLECYSTECTOMY    . HAND SURGERY Left   . LEG SURGERY Left    Growth was removed.  . Right hand      Family History  Problem Relation Age of Onset  . Cancer Mother        lung  . Diabetes Father   . Stroke Father   . Deafness Brother   . Cystic fibrosis Other     Social History   Socioeconomic History  . Marital status: Single    Spouse name: Not on file  . Number of children: Not on file  . Years of education: Not on file  . Highest education level: Not on file  Occupational History  . Not on file  Social Needs  . Financial resource strain: Not on file  . Food insecurity:    Worry: Not on file    Inability: Not on file  . Transportation needs:    Medical: Not on file    Non-medical: Not on file  Tobacco Use  . Smoking  status: Current Every Day Smoker    Packs/day: 0.50    Types: Cigarettes  . Smokeless tobacco: Never Used  Substance and Sexual Activity  . Alcohol use: No  . Drug use: Yes    Types: Benzodiazepines, IV    Comment: heroin  . Sexual activity: Yes    Birth control/protection: None  Lifestyle  . Physical activity:    Days per week: Not on file    Minutes per session: Not on file  . Stress: Not on file  Relationships  . Social connections:    Talks on phone: Not on file    Gets together: Not on file    Attends religious service: Not on file    Active member of club or organization: Not on file    Attends meetings of clubs or organizations: Not on file    Relationship status: Not on file  . Intimate partner violence:    Fear of current or ex partner: Not on file    Emotionally abused: Not on file    Physically abused: Not on file    Forced sexual activity: Not on file  Other Topics Concern  . Not on file  Social History Narrative  . Not on file     Physical Exam  Vital Signs and Nursing Notes reviewed Vitals:   01/07/19 2230 01/07/19 2245  BP: 107/70 117/70  Pulse: 89 98  Resp:  16  Temp:    SpO2: 100% 98%    CONSTITUTIONAL: Well-appearing, NAD NEURO:  Alert and oriented x 3, no focal deficits EYES:  eyes equal and reactive ENT/NECK:  no LAD, no JVD CARDIO: Regular rate, well-perfused, normal S1 and S2 PULM:  CTAB no wheezing or rhonchi GI/GU:  normal bowel sounds, non-distended, non-tender MSK/SPINE:  No gross deformities, no edema SKIN: Significant edema to the volar and dorsal aspect of the right hand with diffuse erythema, tender to palpation, good cap refill, neurovascularly intact. PSYCH:  Appropriate speech and behavior  Diagnostic and Interventional Summary    Labs Reviewed  CBC - Abnormal; Notable for the following components:      Result Value   Hemoglobin 11.3 (*)    HCT 35.9 (*)    MCH 25.6 (*)    All other components within normal limits    COMPREHENSIVE METABOLIC PANEL - Abnormal; Notable for the following components:   Sodium 134 (*)    Calcium 8.3 (*)    Total Protein 6.3 (*)    Albumin 2.8 (*)    All other components within normal limits  HCG, SERUM, QUALITATIVE - Abnormal; Notable for the following components:   Preg, Serum POSITIVE (*)    All other components within normal limits  I-STAT BETA HCG BLOOD, ED ([facility], [facility], [facility] ONLY) - Abnormal; Notable for the following components:   I-stat hCG, quantitative 170.4 (*)    All other components within normal limits  CULTURE, BLOOD (SINGLE)    DG Hand Complete Right  Final Result    DG Forearm Right  Final Result    CT HAND RIGHT W CONTRAST    (Results Pending)    Medications  vancomycin (VANCOCIN) IVPB 1000 mg/200 mL premix (1,000 mg Intravenous New Bag/Given 01/07/19 2209)  sodium chloride 0.9 % bolus 1,000 mL (0 mLs Intravenous Stopped 01/07/19 2159)  cefTRIAXone (ROCEPHIN) 2 g in sodium chloride 0.9 % 100 mL IVPB (0 g Intravenous Stopped 01/07/19 2157)     Procedures Critical Care Critical Care Documentation Critical care time provided by me (excluding procedures): 35 minutes  Condition necessitating critical care: Concern for deep space infection of the hand, need for emergent washout  Components of critical care management: reviewing of prior records, laboratory and imaging interpretation, frequent re-examination and reassessment of vital signs, administration of IV antibiotics, IV fluids, discussion with consulting services.    ED Course and Medical Decision Making  I have reviewed the triage vital signs and the nursing notes.  Pertinent labs & imaging results that were available during my care of the patient were reviewed by me and considered in my medical decision making (see below for details).  Concern for deep space infection of the hand in this 33 year old female with history of IV drug use.  Vital signs stable, no systemic symptoms.  Given vancomycin  and ceftriaxone, discussed with Dr. Janee Morn of hand surgery who recommends CT of the hand and will come to likely washout.  Patient did test positive for pregnancy, discussed with radiology, will be able to CT the hand above the head, no danger to the pregnancy.  Antibiotics provided also considered safe during pregnancy.  Will admit to hospitalist service for continued  IV antibiotics.  Elmer SowMichael M. Pilar PlateBero, MD [facility] [email or web address]  Final Clinical Impressions(s) / ED Diagnoses     ICD-10-CM   1. Injury of right hand, initial encounter S69.91XA   2. Cellulitis, unspecified cellulitis site L03.90     ED Discharge Orders    None         Sabas SousBero, Aneta Hendershott M, MD 01/07/19 2303

## 2019-01-08 ENCOUNTER — Observation Stay (HOSPITAL_COMMUNITY): Payer: Self-pay | Admitting: Certified Registered Nurse Anesthetist

## 2019-01-08 ENCOUNTER — Other Ambulatory Visit: Payer: Self-pay

## 2019-01-08 ENCOUNTER — Encounter (HOSPITAL_COMMUNITY): Payer: Self-pay | Admitting: Emergency Medicine

## 2019-01-08 ENCOUNTER — Encounter (HOSPITAL_COMMUNITY)
Admission: EM | Discharge status: Against Medical Advice | Payer: Self-pay | Source: Home / Self Care | Attending: Family Medicine

## 2019-01-08 DIAGNOSIS — L089 Local infection of the skin and subcutaneous tissue, unspecified: Secondary | ICD-10-CM

## 2019-01-08 DIAGNOSIS — O99320 Drug use complicating pregnancy, unspecified trimester: Secondary | ICD-10-CM

## 2019-01-08 DIAGNOSIS — S61431A Puncture wound without foreign body of right hand, initial encounter: Secondary | ICD-10-CM

## 2019-01-08 DIAGNOSIS — F1110 Opioid abuse, uncomplicated: Secondary | ICD-10-CM

## 2019-01-08 DIAGNOSIS — L0390 Cellulitis, unspecified: Secondary | ICD-10-CM | POA: Diagnosis present

## 2019-01-08 DIAGNOSIS — F1910 Other psychoactive substance abuse, uncomplicated: Secondary | ICD-10-CM

## 2019-01-08 HISTORY — PX: I & D EXTREMITY: SHX5045

## 2019-01-08 LAB — CBC WITH DIFFERENTIAL/PLATELET
BASOS PCT: 0 %
Basophils Absolute: 0 10*3/uL (ref 0.0–0.1)
Eosinophils Absolute: 0.2 10*3/uL (ref 0.0–0.5)
Eosinophils Relative: 4 %
HCT: 32.1 % — ABNORMAL LOW (ref 36.0–46.0)
Hemoglobin: 10.4 g/dL — ABNORMAL LOW (ref 12.0–15.0)
Lymphocytes Relative: 43 %
Lymphs Abs: 3 10*3/uL (ref 0.7–4.0)
MCH: 25.9 pg — ABNORMAL LOW (ref 26.0–34.0)
MCHC: 32.4 g/dL (ref 30.0–36.0)
MCV: 80 fL (ref 80.0–100.0)
MONOS PCT: 9 %
Monocytes Absolute: 0.6 10*3/uL (ref 0.1–1.0)
NEUTROS PCT: 44 %
Neutro Abs: 3.1 10*3/uL (ref 1.7–7.7)
Platelets: 357 10*3/uL (ref 150–400)
RBC: 4.01 MIL/uL (ref 3.87–5.11)
RDW: 15.1 % (ref 11.5–15.5)
WBC Morphology: ABNORMAL
WBC: 7 10*3/uL (ref 4.0–10.5)
nRBC: 0 % (ref 0.0–0.2)

## 2019-01-08 LAB — BASIC METABOLIC PANEL
Anion gap: 8 (ref 5–15)
BUN: 6 mg/dL (ref 6–20)
CO2: 24 mmol/L (ref 22–32)
Calcium: 8.4 mg/dL — ABNORMAL LOW (ref 8.9–10.3)
Chloride: 107 mmol/L (ref 98–111)
Creatinine, Ser: 0.73 mg/dL (ref 0.44–1.00)
GFR calc Af Amer: >60 mL/min (ref >60)
GFR calc non Af Amer: >60 mL/min (ref >60)
Glucose, Bld: 94 mg/dL (ref 70–99)
POTASSIUM: 4.2 mmol/L (ref 3.5–5.1)
Sodium: 139 mmol/L (ref 135–145)

## 2019-01-08 LAB — HIV ANTIBODY (ROUTINE TESTING W REFLEX): HIV Screen 4th Generation wRfx: NONREACTIVE

## 2019-01-08 SURGERY — IRRIGATION AND DEBRIDEMENT EXTREMITY
Anesthesia: General | Site: Hand | Laterality: Right

## 2019-01-08 MED ORDER — BUPIVACAINE HCL 0.5 % IJ SOLN
INTRAMUSCULAR | Status: AC
  Filled 2019-01-08: qty 1

## 2019-01-08 MED ORDER — LIDOCAINE HCL (PF) 1 % IJ SOLN
INTRAMUSCULAR | Status: AC
  Filled 2019-01-08: qty 30

## 2019-01-08 MED ORDER — PROPOFOL 10 MG/ML IV BOLUS
INTRAVENOUS | Status: DC | PRN
  Administered 2019-01-08: 200 mg via INTRAVENOUS

## 2019-01-08 MED ORDER — ACETAMINOPHEN 325 MG PO TABS
650.0000 mg | ORAL_TABLET | Freq: Four times a day (QID) | ORAL | Status: DC | PRN
  Administered 2019-01-08: 650 mg via ORAL
  Filled 2019-01-08 (×2): qty 2

## 2019-01-08 MED ORDER — ONDANSETRON HCL 4 MG/2ML IJ SOLN
INTRAMUSCULAR | Status: DC | PRN
  Administered 2019-01-08: 4 mg via INTRAVENOUS

## 2019-01-08 MED ORDER — SODIUM CHLORIDE 0.9 % IV BOLUS: 500.0000 mL | Freq: Once | INTRAVENOUS | Status: DC

## 2019-01-08 MED ORDER — SODIUM CHLORIDE 0.9 % IV SOLN: INTRAVENOUS | Status: AC

## 2019-01-08 MED ORDER — 0.9 % SODIUM CHLORIDE (POUR BTL) OPTIME
TOPICAL | Status: DC | PRN
  Administered 2019-01-08: 3000 mL
  Administered 2019-01-08: 1000 mL

## 2019-01-08 MED ORDER — VANCOMYCIN HCL IN DEXTROSE 1-5 GM/200ML-% IV SOLN
1000.0000 mg | Freq: Two times a day (BID) | INTRAVENOUS | Status: DC
  Administered 2019-01-08: 1000 mg via INTRAVENOUS
  Filled 2019-01-08 (×2): qty 200

## 2019-01-08 MED ORDER — DEXAMETHASONE SODIUM PHOSPHATE 10 MG/ML IJ SOLN
INTRAMUSCULAR | Status: AC
  Filled 2019-01-08: qty 1

## 2019-01-08 MED ORDER — MEPERIDINE HCL 50 MG/ML IJ SOLN: 6.2500 mg | INTRAMUSCULAR | Status: DC | PRN

## 2019-01-08 MED ORDER — ONDANSETRON HCL 4 MG/2ML IJ SOLN: 4.0000 mg | Freq: Four times a day (QID) | INTRAMUSCULAR | Status: DC | PRN

## 2019-01-08 MED ORDER — HYDROMORPHONE HCL 1 MG/ML IJ SOLN
INTRAMUSCULAR | Status: AC
  Filled 2019-01-08: qty 1

## 2019-01-08 MED ORDER — LIDOCAINE 2% (20 MG/ML) 5 ML SYRINGE
INTRAMUSCULAR | Status: DC | PRN
  Administered 2019-01-08: 100 mg via INTRAVENOUS

## 2019-01-08 MED ORDER — PROPOFOL 10 MG/ML IV BOLUS
INTRAVENOUS | Status: AC
  Filled 2019-01-08: qty 20

## 2019-01-08 MED ORDER — HYDROMORPHONE HCL 1 MG/ML IJ SOLN
INTRAMUSCULAR | Status: AC
  Administered 2019-01-08: 0.5 mg via INTRAVENOUS
  Filled 2019-01-08: qty 1

## 2019-01-08 MED ORDER — POLYETHYLENE GLYCOL 3350 17 G PO PACK: 17.0000 g | PACK | Freq: Every day | ORAL | Status: DC | PRN

## 2019-01-08 MED ORDER — FENTANYL CITRATE (PF) 100 MCG/2ML IJ SOLN: 25.0000 ug | INTRAMUSCULAR | Status: DC | PRN

## 2019-01-08 MED ORDER — DEXAMETHASONE SODIUM PHOSPHATE 10 MG/ML IJ SOLN
INTRAMUSCULAR | Status: DC | PRN
  Administered 2019-01-08: 10 mg via INTRAVENOUS

## 2019-01-08 MED ORDER — KETOROLAC TROMETHAMINE 15 MG/ML IJ SOLN: 15.0000 mg | Freq: Four times a day (QID) | INTRAMUSCULAR | Status: DC | PRN

## 2019-01-08 MED ORDER — LACTATED RINGERS IV SOLN
INTRAVENOUS | Status: DC | PRN
  Administered 2019-01-08: 09:00:00 via INTRAVENOUS

## 2019-01-08 MED ORDER — ONDANSETRON HCL 4 MG PO TABS: 4.0000 mg | ORAL_TABLET | Freq: Four times a day (QID) | ORAL | Status: DC | PRN

## 2019-01-08 MED ORDER — VANCOMYCIN HCL IN DEXTROSE 1-5 GM/200ML-% IV SOLN
INTRAVENOUS | Status: AC
  Filled 2019-01-08: qty 200

## 2019-01-08 MED ORDER — ONDANSETRON HCL 4 MG/2ML IJ SOLN
INTRAMUSCULAR | Status: AC
  Filled 2019-01-08: qty 2

## 2019-01-08 MED ORDER — TETANUS-DIPHTH-ACELL PERTUSSIS 5-2.5-18.5 LF-MCG/0.5 IM SUSP
0.5000 mL | Freq: Once | INTRAMUSCULAR | Status: DC
  Filled 2019-01-08: qty 0.5

## 2019-01-08 MED ORDER — FENTANYL CITRATE (PF) 250 MCG/5ML IJ SOLN
INTRAMUSCULAR | Status: AC
  Filled 2019-01-08: qty 5

## 2019-01-08 MED ORDER — BACITRACIN ZINC 500 UNIT/GM EX OINT
TOPICAL_OINTMENT | Freq: Two times a day (BID) | CUTANEOUS | Status: DC
  Filled 2019-01-08: qty 28.4

## 2019-01-08 MED ORDER — HYDROMORPHONE HCL 1 MG/ML IJ SOLN
0.2500 mg | INTRAMUSCULAR | Status: DC | PRN
  Administered 2019-01-08 (×3): 0.5 mg via INTRAVENOUS

## 2019-01-08 MED ORDER — PROMETHAZINE HCL 25 MG/ML IJ SOLN: 6.2500 mg | INTRAMUSCULAR | Status: DC | PRN

## 2019-01-08 MED ORDER — MIDAZOLAM HCL 2 MG/2ML IJ SOLN
INTRAMUSCULAR | Status: AC
  Filled 2019-01-08: qty 2

## 2019-01-08 MED ORDER — PROPOFOL 500 MG/50ML IV EMUL
INTRAVENOUS | Status: DC | PRN
  Administered 2019-01-08: 150 ug/kg/min via INTRAVENOUS

## 2019-01-08 MED ORDER — ACETAMINOPHEN 650 MG RE SUPP: 650.0000 mg | Freq: Four times a day (QID) | RECTAL | Status: DC | PRN

## 2019-01-08 MED ORDER — FENTANYL CITRATE (PF) 250 MCG/5ML IJ SOLN
INTRAMUSCULAR | Status: DC | PRN
  Administered 2019-01-08 (×4): 50 ug via INTRAVENOUS

## 2019-01-08 SURGICAL SUPPLY — 40 items
BANDAGE ACE 4X5 VEL STRL LF (GAUZE/BANDAGES/DRESSINGS) ×2 IMPLANT
BANDAGE COBAN STERILE 2 (GAUZE/BANDAGES/DRESSINGS) IMPLANT
BLADE SURG 15 STRL LF DISP TIS (BLADE) ×1 IMPLANT
BLADE SURG 15 STRL SS (BLADE) ×3
BNDG COHESIVE 4X5 TAN STRL (GAUZE/BANDAGES/DRESSINGS) ×3 IMPLANT
BNDG GAUZE ELAST 4 BULKY (GAUZE/BANDAGES/DRESSINGS) ×6 IMPLANT
CHLORAPREP W/TINT 26ML (MISCELLANEOUS) ×3 IMPLANT
CORD BIPOLAR FORCEPS 12FT (ELECTRODE) ×3 IMPLANT
COVER WAND RF STERILE (DRAPES) ×3 IMPLANT
CUFF TOURNIQUET SINGLE 18IN (TOURNIQUET CUFF) IMPLANT
DRAPE SURG 17X23 STRL (DRAPES) ×3 IMPLANT
DRSG ADAPTIC 3X8 NADH LF (GAUZE/BANDAGES/DRESSINGS) ×2 IMPLANT
DRSG EMULSION OIL 3X3 NADH (GAUZE/BANDAGES/DRESSINGS) ×3 IMPLANT
GAUZE SPONGE 4X4 12PLY STRL (GAUZE/BANDAGES/DRESSINGS) ×3 IMPLANT
GLOVE BIO SURGEON STRL SZ7.5 (GLOVE) ×3 IMPLANT
GLOVE BIOGEL PI IND STRL 7.0 (GLOVE) ×1 IMPLANT
GLOVE BIOGEL PI IND STRL 8 (GLOVE) ×1 IMPLANT
GLOVE BIOGEL PI INDICATOR 7.0 (GLOVE) ×2
GLOVE BIOGEL PI INDICATOR 8 (GLOVE) ×2
GLOVE ECLIPSE 6.5 STRL STRAW (GLOVE) ×3 IMPLANT
GOWN STRL REUS W/ TWL LRG LVL3 (GOWN DISPOSABLE) ×2 IMPLANT
GOWN STRL REUS W/TWL LRG LVL3 (GOWN DISPOSABLE) ×6
GOWN STRL REUS W/TWL XL LVL3 (GOWN DISPOSABLE) ×3 IMPLANT
KIT BASIN OR (CUSTOM PROCEDURE TRAY) ×3 IMPLANT
NDL HYPO 25X1 1.5 SAFETY (NEEDLE) IMPLANT
NEEDLE HYPO 25X1 1.5 SAFETY (NEEDLE) IMPLANT
NS IRRIG 1000ML POUR BTL (IV SOLUTION) ×3 IMPLANT
PACK ORTHO EXTREMITY (CUSTOM PROCEDURE TRAY) ×3 IMPLANT
PAD CAST 4YDX4 CTTN HI CHSV (CAST SUPPLIES) IMPLANT
PADDING CAST ABS 4INX4YD NS (CAST SUPPLIES)
PADDING CAST ABS COTTON 4X4 ST (CAST SUPPLIES) IMPLANT
PADDING CAST COTTON 4X4 STRL (CAST SUPPLIES) ×3
RUBBERBAND STERILE (MISCELLANEOUS) IMPLANT
SET IRRIG Y TYPE TUR BLADDER L (SET/KITS/TRAYS/PACK) IMPLANT
STOCKINETTE 6  STRL (DRAPES) ×2
STOCKINETTE 6 STRL (DRAPES) ×1 IMPLANT
SUT VICRYL RAPIDE 4-0 (SUTURE) IMPLANT
SUT VICRYL RAPIDE 4/0 PS 2 (SUTURE) IMPLANT
SYR 10ML LL (SYRINGE) IMPLANT
UNDERPAD 30X30 (UNDERPADS AND DIAPERS) ×3 IMPLANT

## 2019-01-08 NOTE — Progress Notes (Signed)
Patient was noted to be leaving the floor with a female in her hospital gown.  Security was called and they are looking over the hospital for the patient.  She has left personal items in the room.  It appears that the IV is still in place of her right foot and left forearm

## 2019-01-08 NOTE — Progress Notes (Signed)
OT Cancellation Note  Patient Details Name: Pamela Zimmerman MRN: [id number] DOB: 1986-03-27   Cancelled Treatment:    Reason Eval/Treat Not Completed: Other (comment)(Pt walked away from floor  With man and security is currently looking for her.) OT will continue to follow for evaluation.   Evern BioLaura J Kendryck Lacroix 01/08/2019, 1:47 PM  Sherryl MangesLaura Jamesia Linnen OTR/L Acute Rehabilitation Services Pager: [phone] Office: [phone]

## 2019-01-08 NOTE — Progress Notes (Signed)
The patient is on the telephone and arguing with someone.  The patient has been quiet and cooperative.  Right hand is elevated on pillow.
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
## 2019-01-08 NOTE — Progress Notes (Signed)
Thick pus drained and irrigated. Seemed confined to SQ space.  Surgery Recommendations: 1. Continue IV antibiotics until certain of clinical response and cultures guide PO selection 2. Begin wound care as ordered on 01-09-19.  Wound to be allowed to close naturally 3. Will need transition to outpatient hand therapy to ensure recovery of digital and wrist ROM 4. Will need d/c arrangements made for a few days following d/c to ensure infection has continued to resolve according to the d/c treatment plan 5. I will instruct patient to f/u with me in 3 weeks to re-check surgical issues such as wound closure and digit ROM  Please call with surgical concerns as they may arise.  Neil Crouch, MD Hand Surgery  Mobile [phone]

## 2019-01-08 NOTE — Progress Notes (Signed)
The after hours office number called and a message has been sent to Dr Janee Morn.  Awaiting return call.

## 2019-01-08 NOTE — Anesthesia Procedure Notes (Signed)
Procedure Name: LMA Insertion Date/Time: 01/08/2019 9:18 AM Performed by: Alvera Novel, CRNA Pre-anesthesia Checklist: Patient identified, Emergency Drugs available, Suction available and Patient being monitored Patient Re-evaluated:Patient Re-evaluated prior to induction Oxygen Delivery Method: Circle System Utilized Preoxygenation: Pre-oxygenation with 100% oxygen Induction Type: IV induction Ventilation: Mask ventilation without difficulty LMA: LMA inserted LMA Size: 4.0 Number of attempts: 1 Placement Confirmation: positive ETCO2 Tube secured with: Tape Dental Injury: Teeth and Oropharynx as per pre-operative assessment

## 2019-01-08 NOTE — Discharge Instructions (Signed)
Perform wound care as directed in the hospital--twice daily cleansing with soap/running water, redress with bacitracin ointment just in the open wound,covered with light dressing.  Work to make your fingers fully flex and fully extend
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
## 2019-01-08 NOTE — Progress Notes (Signed)
Dr Janee Morn returned call and was made aware of the patient leaving the hospital unit.  No further information at this time.

## 2019-01-08 NOTE — Op Note (Signed)
01/07/2019 - 01/08/2019  8:11 AM  PATIENT:  Pamela Zimmerman  33 y.o. female  PRE-OPERATIVE DIAGNOSIS:  Right hand abscess  POST-OPERATIVE DIAGNOSIS:  Same  PROCEDURE:  Incision and drainage of subcutaneous abscess and 1st dorsal muscle compartment  SURGEON: Cliffton Astersavid A. Janee Mornhompson, MD  PHYSICIAN ASSISTANT: none  ANESTHESIA:  general  SPECIMENS:  Swabs to micro  DRAINS:   penrose  EBL:  less than 50 mL  PREOPERATIVE INDICATIONS:  Pamela Zimmerman is a  33 y.o. female with a right hand abscess following needle puncture a few days ago  The risks benefits and alternatives were discussed with the patient preoperatively including but not limited to the risks of infection, bleeding, nerve injury, cardiopulmonary complications, the need for revision surgery, among others, and the patient verbalized understanding and consented to proceed.  OPERATIVE IMPLANTS: none  OPERATIVE PROCEDURE:  The patient was escorted to the operative theatre and placed in a supine position.  New intravenous access was established in the right foot and general anesthesia was administered.  A surgical "time-out" was performed during which the planned procedure, proposed operative site, and the correct patient identity were compared to the operative consent and agreement confirmed by the circulating nurse according to current facility policy.  Following application of a tourniquet to the operative extremity, the exposed skin was prepped with Chloraprep and draped in the usual sterile fashion.  The limb was exsanguinated with gravity and the tourniquet inflated to approximately 100mmHg higher than systolic BP.  During the case, anesthesia also established another, more durable peripheral IV in the left forearm.  On the right side, incision was made on the radial aspect of the first webspace, over the prominence of the process.  Thick purulence spreading forth and cultures were obtained.  Spreading dissection was carried out  the subcutaneous plane in this area.  Irrigation begun, spreading was carried out across the dorsum of the hand in the subcutaneous space, followed by digital dissection.  Crossing structures over the first webspace muscle fascia were pulled aside and the fascia was split to ensure adequate washout of the compartment.  The muscle was red and healthy, and it was unclear whether the process that extended into it or not.  Nonetheless, 3 L of irrigant was washed through it, Penrose placed, and decision made not to close the wound.  Bulky dressing was applied and she was awakened and taken to the recovery room in stable condition, breathing spontaneously.  DISPOSITION: She will be returned to the floor for continued inpatient care, awaiting clinical improvement, antibiotic therapy initially broad, and then narrowed and guided by cultures.

## 2019-01-08 NOTE — Progress Notes (Signed)
The patient's home phone number was called and the person answering the phone call did not know how to get it touch with her

## 2019-01-08 NOTE — Progress Notes (Signed)
0.5 of Dilaudid was wasted with Loraine Leriche RN

## 2019-01-08 NOTE — Progress Notes (Signed)
Patient refusing saline infusion. States that infusion is burning and hurting. Patient crying. No evidence of infiltration or occlusion. MD notified per order. Nursing will continue to monitor.

## 2019-01-08 NOTE — Progress Notes (Addendum)
PROGRESS NOTE    Pamela Zimmerman  UPB:[id number] DOB: 1986-10-14 DOA: 01/07/2019 PCP: Mechele Claude, MD    Brief Narrative:  Pamela Zimmerman is a 33 y.o. female with medical history significant for IV drug abuse, now presenting to the emergency department for evaluation of pain, redness, and swelling involving the right hand.  Patient reports that she was in her usual state of health approximately 4 days prior to presentation when she was inadvertently stuck by a needle while reaching into a bag.  Since that time, she has developed progressive pain, swelling, and redness.  She denies any fevers or chills.  She denies any other rash, wound, or boil.  She believes her last menstrual period was sometime in November.  Does not recall last tetanus shot.  ED Course: Upon arrival to the ED, patient is found to be afebrile, saturating well on room air, heart rate 108, blood pressure 100/80.  Chemistry panel features a slight hyponatremia and albumin of 2.8.  CBC is notable for mild normocytic anemia.  Pregnancy test is positive.  Plain films of the right forearm are negative and CT of the right hand demonstrates diffuse subcutaneous edema and skin thickening on the dorsum of the hand with a phlegmon/developing abscess dorsal to the first metacarpal.  Blood cultures were collected and the patient was treated with vancomycin and Rocephin.  Hand surgery was consulted by the ED physician and recommended medical admission.   Assessment & Plan:   Principal Problem:   Puncture wound of right hand with infection Active Problems:   Pregnancy   Heroin abuse (HCC)   IVDA (intravenous drug abuse) complicating pregnancy (HCC)   Right upper extremity cellulitis with abscess  - Presents with progressive redness, swelling, and pain involving right hand that began after needle stick 4-5 days ago  - s/p OR with hand surgery - Blood cultures collected - continue Rocephin and vancomycin administered - follow up  on cultures    IV drug abuse  - Acknowledges ongoing use - SW consulted  - Check HIV and viral hepatitis    Pregnancy  - Pregnancy test positive in ED  - LMP uncertain, she thinks sometime in November  - Denies abd pain, bleeding, or swelling - She asks for help finding OBGYN, case management consulted     DVT prophylaxis: SCDs Code Status: Full Code Family Communication: No family bedside Disposition Plan: likely discharge in 2-3 days   Consultants:   Hand surgery  Procedures:  Incision and drainage of subcutaneous abscess and 1st dorsal muscle compartment  Antimicrobials:   Rocephin and vancomycin   Subjective: Patient seen after going to OR.  Stating pain control is inadequate.  Has not had anything to eat or drink since returning from OR but has been able to ambulate to and from the bathroom.  Objective: Vitals:   01/08/19 1026 01/08/19 1030 01/08/19 1041 01/08/19 1050  BP:  118/68 114/72 129/83  Pulse:  (!) 54 (!) 52 (!) 54  Resp: 16 15 16 18   Temp:   97.7 F (36.5 C) 98 F (36.7 C)  TempSrc:    Oral  SpO2:  100% 99% 99%  Weight:      Height:        Intake/Output Summary (Last 24 hours) at 01/08/2019 1250 Last data filed at 01/08/2019 1104 Gross per 24 hour  Intake 2060 ml  Output 15 ml  Net 2045 ml   Filed Weights   01/07/19 1917  Weight: 55.8 kg  Examination:  General exam: Appears calm and comfortable  Respiratory system: Clear to auscultation. Respiratory effort normal. Cardiovascular system: S1 & S2 heard, RRR. No JVD, murmurs, rubs, gallops or clicks. No pedal edema. Gastrointestinal system: Abdomen is nondistended, soft and nontender. No organomegaly or masses felt. Normal bowel sounds heard. Central nervous system: Alert and oriented. No focal neurological deficits. Extremities: Symmetric 5 x 5 power. Right upper extremity with surgical dressing that is C/D/I.  5/5 fingers on right hand are neurovascularly intact.   Skin: few  scattered injection sites on arms, no appreciable redness or swelling on visible upper extremities. Psychiatry: Judgement and insight appear normal. Mood & affect appropriate.     Data Reviewed: I have personally reviewed following labs and imaging studies  CBC: Recent Labs  Lab 01/07/19 2043 01/08/19 0622  WBC 8.8 7.0  NEUTROABS  --  3.1  HGB 11.3* 10.4*  HCT 35.9* 32.1*  MCV 81.2 80.0  PLT 392 357   Basic Metabolic Panel: Recent Labs  Lab 01/07/19 2043 01/08/19 0622  NA 134* 139  K 4.4 4.2  CL 104 107  CO2 22 24  GLUCOSE 99 94  BUN 8 6  CREATININE 0.68 0.73  CALCIUM 8.3* 8.4*   GFR: Estimated Creatinine Clearance: 87.3 mL/min (by C-G formula based on SCr of 0.73 mg/dL). Liver Function Tests: Recent Labs  Lab 01/07/19 2043  AST 30  ALT 28  ALKPHOS 58  BILITOT 0.6  PROT 6.3*  ALBUMIN 2.8*   No results for input(s): LIPASE, AMYLASE in the last 168 hours. No results for input(s): AMMONIA in the last 168 hours. Coagulation Profile: No results for input(s): INR, PROTIME in the last 168 hours. Cardiac Enzymes: No results for input(s): CKTOTAL, CKMB, CKMBINDEX, TROPONINI in the last 168 hours. BNP (last 3 results) No results for input(s): PROBNP in the last 8760 hours. HbA1C: No results for input(s): HGBA1C in the last 72 hours. CBG: No results for input(s): GLUCAP in the last 168 hours. Lipid Profile: No results for input(s): CHOL, HDL, LDLCALC, TRIG, CHOLHDL, LDLDIRECT in the last 72 hours. Thyroid Function Tests: No results for input(s): TSH, T4TOTAL, FREET4, T3FREE, THYROIDAB in the last 72 hours. Anemia Panel: No results for input(s): VITAMINB12, FOLATE, FERRITIN, TIBC, IRON, RETICCTPCT in the last 72 hours. Sepsis Labs: No results for input(s): PROCALCITON, LATICACIDVEN in the last 168 hours.  No results found for this or any previous visit (from the past 240 hour(s)).       Radiology Studies: Dg Forearm Right  Result Date:  01/07/2019 CLINICAL DATA:  Recent needle puncture, initial encounter EXAM: RIGHT FOREARM - 2 VIEW COMPARISON:  None. FINDINGS: No acute fracture or dislocation is noted. No gross soft tissue abnormality is seen. No radiopaque foreign body is noted. IMPRESSION: No acute abnormality noted. Electronically Signed   By: Alcide Clever M.D.   On: 01/07/2019 20:04   Ct Hand Right W Contrast  Result Date: 01/08/2019 CLINICAL DATA:  33 year old female, IV drug use with recent penetrating injury by a needle. Patient has been experiencing worsening pain and redness of the right hand. EXAM: CT OF THE UPPER RIGHT EXTREMITY WITH CONTRAST TECHNIQUE: Multidetector CT imaging of the upper right extremity was performed according to the standard protocol following intravenous contrast administration. COMPARISON:  Right hand radiograph dated 01/07/2019 CONTRAST:  OMNIPAQUE IOHEXOL 300 MG/ML  SOLN FINDINGS: Bones/Joint/Cartilage There is no acute fracture or dislocation. No arthritic changes. No joint effusion. Ligaments Suboptimally assessed by CT. Muscles and Tendons No  intramuscular hematoma or evidence of acute muscular injury. Soft tissues There is diffuse subcutaneous soft tissue edema and skin thickening over the dorsum of the hand. There is a 1.7 x 0.9 cm low attenuating area in the superficial soft tissues dorsal to the first metacarpal consistent with a phlegmon/developing abscess. IMPRESSION: 1. No acute fracture or dislocation. 2. Diffuse subcutaneous edema and skin thickening of the dorsum of the hand and area of phlegmon/developing abscess in the superficial soft tissues dorsal to the first metacarpal. Electronically Signed   By: Elgie CollardArash  Radparvar M.D.   On: 01/08/2019 00:09   Dg Hand Complete Right  Result Date: 01/07/2019 CLINICAL DATA:  Needle stick in lateral hand adjacent to the thumb, initial encounter EXAM: RIGHT HAND - COMPLETE 3+ VIEW COMPARISON:  None. FINDINGS: No acute fracture or dislocation is  noted. No soft tissue abnormality is noted. No radiopaque foreign body is seen. IMPRESSION: No acute abnormality noted. Electronically Signed   By: Alcide CleverMark  Lukens M.D.   On: 01/07/2019 19:58        Scheduled Meds: . [START ON 01/09/2019] bacitracin   Topical BID  . HYDROmorphone      . Tdap  0.5 mL Intramuscular Once   Continuous Infusions: . sodium chloride    . vancomycin       LOS: 0 days    Time spent: 40 minutes    Katrinka BlazingAlex U Kadolph, MD [facility] Pager [phone]  If 7PM-7AM, please contact night-coverage [email or web address] Password [email or web address] 01/08/2019, 12:50 PM

## 2019-01-08 NOTE — Anesthesia Postprocedure Evaluation (Signed)
Anesthesia Post Note  Patient: Pamela AlertRebecca K Butler  Procedure(s) Performed: IRRIGATION AND DEBRIDEMENT, RIGHT HAND (Right Hand)     Patient location during evaluation: PACU Anesthesia Type: General Level of consciousness: sedated and patient cooperative Pain management: pain level controlled Vital Signs Assessment: post-procedure vital signs reviewed and stable Respiratory status: spontaneous breathing Cardiovascular status: stable Anesthetic complications: no    Last Vitals:  Vitals:   01/08/19 1041 01/08/19 1050  BP: 114/72 129/83  Pulse: (!) 52 (!) 54  Resp: 16 18  Temp: 36.5 C 36.7 C  SpO2: 99% 99%    Last Pain:  Vitals:   01/08/19 1102  TempSrc:   PainSc: 8                  Lewie LoronJohn Kjerstin Abrigo

## 2019-01-08 NOTE — Anesthesia Preprocedure Evaluation (Addendum)
Anesthesia Evaluation  Patient identified by MRN, date of birth, ID band Patient awake    Reviewed: Allergy & Precautions, NPO status , Patient's Chart, lab work & pertinent test results  Airway Mallampati: II  TM Distance: >3 FB Neck ROM: Full    Dental  (+) Dental Advisory Given   Pulmonary neg pulmonary ROS, Current Smoker,    Pulmonary exam normal breath sounds clear to auscultation       Cardiovascular negative cardio ROS Normal cardiovascular exam Rhythm:Regular Rate:Normal     Neuro/Psych negative neurological ROS  negative psych ROS   GI/Hepatic negative GI ROS, Neg liver ROS,   Endo/Other  negative endocrine ROS  Renal/GU negative Renal ROS     Musculoskeletal negative musculoskeletal ROS (+)   Abdominal   Peds  Hematology negative hematology ROS (+)   Anesthesia Other Findings   Reproductive/Obstetrics (+) Pregnancy                             Anesthesia Physical Anesthesia Plan  ASA: II  Anesthesia Plan: General   Post-op Pain Management:    Induction: Intravenous  PONV Risk Score and Plan: 3 and Ondansetron, Dexamethasone, Propofol infusion and Treatment may vary due to age or medical condition  Airway Management Planned: LMA  Additional Equipment: None  Intra-op Plan:   Post-operative Plan: Extubation in OR  Informed Consent: I have reviewed the patients History and Physical, chart, labs and discussed the procedure including the risks, benefits and alternatives for the proposed anesthesia with the patient or authorized representative who has indicated his/her understanding and acceptance.     Dental advisory given  Plan Discussed with: CRNA  Anesthesia Plan Comments: (Discussed the unknown risk of teratogenicity with anesthesia. Explained will do TIVA to attempt to minimize that risk. )       Anesthesia Quick Evaluation

## 2019-01-08 NOTE — Progress Notes (Signed)
Report given to [facility], RN in [employer].  The nurse was made aware of no consents signed. The patient is awaiting for the MD to talk with her regarding the procedure.  The patient has been NPO since receiving on the floor.

## 2019-01-08 NOTE — Transfer of Care (Signed)
Immediate Anesthesia Transfer of Care Note  Patient: Pamela Zimmerman  Procedure(s) Performed: IRRIGATION AND DEBRIDEMENT, RIGHT HAND (Right Hand)  Patient Location: PACU  Anesthesia Type:General  Level of Consciousness: awake, alert  and oriented  Airway & Oxygen Therapy: Patient Spontanous Breathing and Patient connected to nasal cannula oxygen  Post-op Assessment: Report given to RN and Post -op Vital signs reviewed and stable  Post vital signs: Reviewed and stable  Last Vitals:  Vitals Value Taken Time  BP 121/87 01/08/2019 10:02 AM  Temp    Pulse 61 01/08/2019 10:04 AM  Resp 14 01/08/2019 10:04 AM  SpO2 99 % 01/08/2019 10:04 AM  Vitals shown include unvalidated device data.  Last Pain:  Vitals:   01/08/19 0625  TempSrc:   PainSc: 0-No pain         Complications: No apparent anesthesia complications

## 2019-01-08 NOTE — Progress Notes (Signed)
Pharmacy Antibiotic Note  Pamela Zimmerman is a 33 y.o. female admitted on 01/07/2019 with hand abcess.  Pharmacy has been consulted for Vancomycin  Dosing.  Vancomycin 1 g IV given in ED at  2200  Plan: Vancomycin 1 g IV q12h  Height: 5\' 6"  (167.6 cm) Weight: 123 lb (55.8 kg) IBW/kg (Calculated) : 59.3  Temp (24hrs), Avg:98.4 F (36.9 C), Min:98.3 F (36.8 C), Max:98.4 F (36.9 C)  Recent Labs  Lab 01/07/19 2043  WBC 8.8  CREATININE 0.68    Estimated Creatinine Clearance: 87.3 mL/min (by C-G formula based on SCr of 0.68 mg/dL).    No Known Allergies   Eddie Candle 01/08/2019 12:47 AM

## 2019-01-09 ENCOUNTER — Encounter (HOSPITAL_COMMUNITY): Payer: Self-pay | Admitting: Orthopedic Surgery

## 2019-01-09 LAB — HEPATITIS PANEL, ACUTE
HCV Ab: >11 s/co ratio — ABNORMAL HIGH (ref 0.0–0.9)
Hep A IgM: NEGATIVE
Hep B C IgM: NEGATIVE
Hepatitis B Surface Ag: NEGATIVE

## 2019-01-12 LAB — CULTURE, BLOOD (SINGLE)
Culture: NO GROWTH
Special Requests: ADEQUATE

## 2019-01-13 LAB — AEROBIC/ANAEROBIC CULTURE W GRAM STAIN (SURGICAL/DEEP WOUND)

## 2019-01-13 LAB — AEROBIC/ANAEROBIC CULTURE (SURGICAL/DEEP WOUND)

## 2019-01-31 NOTE — Discharge Summary (Signed)
Physician Discharge Summary  AEVA ALSMAN IRC:[id number] DOB: July 10, 1986 DOA: 01/07/2019  PCP: Mechele Claude, MD  Admit date: 01/07/2019 Discharge date: 01/08/19  Admitted From: 01/07/19 Disposition:  01/08/19  Recommendations for Outpatient Follow-up:  1. No recommendations were given as patient left AMA  Home Health:none  Equipment/Devices:none   Discharge Condition: appeared stable  CODE STATUS:Full Code  Diet recommendation: regular  Brief/Interim Summary: Pamela Zimmerman a 33 y.o.femalewith medical history significant forIV drug abuse, now presenting to the emergency department for evaluation of pain, redness, and swelling involving the right hand. Patient reports that she was in her usual state of health approximately 4 days prior to presentation when she was inadvertently stuck by a needle while reaching into a bag. Since that time, she has developed progressive pain, swelling, and redness. She denies any fevers or chills. She denies any other rash, wound, or boil. She believes her last menstrual period was sometime in November. Does not recall last tetanus shot.  ED Course:Upon arrival to the ED, patient is found to be afebrile, saturating well on room air, heart rate 108, blood pressure 100/80. Chemistry panel features a slight hyponatremia and albumin of 2.8. CBC is notable for mild normocytic anemia. Pregnancy test is positive. Plain films of the right forearm are negative and CT of the right hand demonstrates diffuse subcutaneous edema and skin thickening on the dorsum of the hand with a phlegmon/developing abscess dorsal to the first metacarpal. Blood cultures were collected and the patient was treated with vancomycin and Rocephin. Hand surgery was consulted by the ED physician and recommended medical admission.  Patient underwent incision and drainage of subcutaneous abscess and 1st dorsal muscle compartment on 01/08/19 by Orthopaedic surgeon Dr. Janee Morn.   She was observed post op and then left AMA.  No discharge instructions or prescriptions were given and no follow up was set up.   Discharge Diagnoses:  Principal Problem:   Puncture wound of right hand with infection Active Problems:   Pregnancy   Heroin abuse (HCC)   IVDA (intravenous drug abuse) complicating pregnancy (HCC)   Cellulitis    Discharge Instructions   Allergies as of 01/08/2019   No Known Allergies     Medication List    You have not been prescribed any medications.    Follow-up Information    Mack Hook, MD In 3 weeks.   Specialty:  Orthopedic Surgery Why:  For wound re-check Contact information: [address] [place] [address] [phone]          No Known Allergies  Consultations:  Orthopedic Surgery   Procedures/Studies: Dg Forearm Right  Result Date: 01/07/2019 CLINICAL DATA:  Recent needle puncture, initial encounter EXAM: RIGHT FOREARM - 2 VIEW COMPARISON:  None. FINDINGS: No acute fracture or dislocation is noted. No gross soft tissue abnormality is seen. No radiopaque foreign body is noted. IMPRESSION: No acute abnormality noted. Electronically Signed   By: Alcide Clever M.D.   On: 01/07/2019 20:04   Ct Hand Right W Contrast  Result Date: 01/08/2019 CLINICAL DATA:  33 year old female, IV drug use with recent penetrating injury by a needle. Patient has been experiencing worsening pain and redness of the right hand. EXAM: CT OF THE UPPER RIGHT EXTREMITY WITH CONTRAST TECHNIQUE: Multidetector CT imaging of the upper right extremity was performed according to the standard protocol following intravenous contrast administration. COMPARISON:  Right hand radiograph dated 01/07/2019 CONTRAST:  OMNIPAQUE IOHEXOL 300 MG/ML  SOLN FINDINGS: Bones/Joint/Cartilage There is no acute fracture or  dislocation. No arthritic changes. No joint effusion. Ligaments Suboptimally assessed by CT. Muscles and Tendons No intramuscular hematoma or  evidence of acute muscular injury. Soft tissues There is diffuse subcutaneous soft tissue edema and skin thickening over the dorsum of the hand. There is a 1.7 x 0.9 cm low attenuating area in the superficial soft tissues dorsal to the first metacarpal consistent with a phlegmon/developing abscess. IMPRESSION: 1. No acute fracture or dislocation. 2. Diffuse subcutaneous edema and skin thickening of the dorsum of the hand and area of phlegmon/developing abscess in the superficial soft tissues dorsal to the first metacarpal. Electronically Signed   By: Elgie Collard M.D.   On: 01/08/2019 00:09   Dg Hand Complete Right  Result Date: 01/07/2019 CLINICAL DATA:  Needle stick in lateral hand adjacent to the thumb, initial encounter EXAM: RIGHT HAND - COMPLETE 3+ VIEW COMPARISON:  None. FINDINGS: No acute fracture or dislocation is noted. No soft tissue abnormality is noted. No radiopaque foreign body is seen. IMPRESSION: No acute abnormality noted. Electronically Signed   By: Alcide Clever M.D.   On: 01/07/2019 19:58       Subjective: Patient was reported to have left AMA  Discharge Exam: Vitals:   01/08/19 1041 01/08/19 1050  BP: 114/72 129/83  Pulse: (!) 52 (!) 54  Resp: 16 18  Temp: 97.7 F (36.5 C) 98 F (36.7 C)  SpO2: 99% 99%   Vitals:   01/08/19 1026 01/08/19 1030 01/08/19 1041 01/08/19 1050  BP:  118/68 114/72 129/83  Pulse:  (!) 54 (!) 52 (!) 54  Resp: 16 15 16 18   Temp:   97.7 F (36.5 C) 98 F (36.7 C)  TempSrc:    Oral  SpO2:  100% 99% 99%  Weight:      Height:        General: Pt is alert, awake, not in acute distress Neurological: Patient was able to ambulate without assistance    The results of significant diagnostics from this hospitalization (including imaging, microbiology, ancillary and laboratory) are listed below for reference.     Microbiology: No results found for this or any previous visit (from the past 240 hour(s)).   Labs: BNP (last 3  results) No results for input(s): BNP in the last 8760 hours. Basic Metabolic Panel: No results for input(s): NA, K, CL, CO2, GLUCOSE, BUN, CREATININE, CALCIUM, MG, PHOS in the last 168 hours. Liver Function Tests: No results for input(s): AST, ALT, ALKPHOS, BILITOT, PROT, ALBUMIN in the last 168 hours. No results for input(s): LIPASE, AMYLASE in the last 168 hours. No results for input(s): AMMONIA in the last 168 hours. CBC: No results for input(s): WBC, NEUTROABS, HGB, HCT, MCV, PLT in the last 168 hours. Cardiac Enzymes: No results for input(s): CKTOTAL, CKMB, CKMBINDEX, TROPONINI in the last 168 hours. BNP: Invalid input(s): POCBNP CBG: No results for input(s): GLUCAP in the last 168 hours. D-Dimer No results for input(s): DDIMER in the last 72 hours. Hgb A1c No results for input(s): HGBA1C in the last 72 hours. Lipid Profile No results for input(s): CHOL, HDL, LDLCALC, TRIG, CHOLHDL, LDLDIRECT in the last 72 hours. Thyroid function studies No results for input(s): TSH, T4TOTAL, T3FREE, THYROIDAB in the last 72 hours.  Invalid input(s): FREET3 Anemia work up No results for input(s): VITAMINB12, FOLATE, FERRITIN, TIBC, IRON, RETICCTPCT in the last 72 hours. Urinalysis    Component Value Date/Time   COLORURINE YELLOW 03/26/2016 1056   APPEARANCEUR CLEAR 03/26/2016 1056   LABSPEC 1.011 03/26/2016  1056   PHURINE 5.5 03/26/2016 1056   GLUCOSEU NEGATIVE 03/26/2016 1056   HGBUR NEGATIVE 03/26/2016 1056   BILIRUBINUR NEGATIVE 03/26/2016 1056   KETONESUR NEGATIVE 03/26/2016 1056   PROTEINUR NEGATIVE 03/26/2016 1056   NITRITE NEGATIVE 03/26/2016 1056   LEUKOCYTESUR NEGATIVE 03/26/2016 1056   Sepsis Labs Invalid input(s): PROCALCITONIN,  WBC,  LACTICIDVEN Microbiology No results found for this or any previous visit (from the past 240 hour(s)).   Time coordinating discharge: less than 30 minutes  SIGNED:   Katrinka BlazingAlex U Kadolph, MD  [facility] 01/31/2019, 2:50  PM Pager [phone] If 7PM-7AM, please contact night-coverage [email or web address] Password [email or web address]

## 2019-02-19 ENCOUNTER — Other Ambulatory Visit: Payer: Self-pay

## 2019-02-19 ENCOUNTER — Inpatient Hospital Stay (HOSPITAL_COMMUNITY): Payer: Self-pay

## 2019-02-19 ENCOUNTER — Encounter (HOSPITAL_COMMUNITY): Payer: Self-pay

## 2019-02-19 ENCOUNTER — Inpatient Hospital Stay (HOSPITAL_COMMUNITY)
Admission: EM | Admit: 2019-02-19 | Discharge: 2019-02-19 | Disposition: A | Discharge status: Home / Self Care | Payer: Self-pay | Attending: Obstetrics & Gynecology | Admitting: Obstetrics & Gynecology

## 2019-02-19 DIAGNOSIS — O26899 Other specified pregnancy related conditions, unspecified trimester: Secondary | ICD-10-CM

## 2019-02-19 DIAGNOSIS — F17210 Nicotine dependence, cigarettes, uncomplicated: Secondary | ICD-10-CM | POA: Insufficient documentation

## 2019-02-19 DIAGNOSIS — O209 Hemorrhage in early pregnancy, unspecified: Secondary | ICD-10-CM

## 2019-02-19 DIAGNOSIS — O200 Threatened abortion: Secondary | ICD-10-CM | POA: Insufficient documentation

## 2019-02-19 DIAGNOSIS — Z3A08 8 weeks gestation of pregnancy: Secondary | ICD-10-CM | POA: Insufficient documentation

## 2019-02-19 DIAGNOSIS — R109 Unspecified abdominal pain: Secondary | ICD-10-CM

## 2019-02-19 DIAGNOSIS — O99321 Drug use complicating pregnancy, first trimester: Secondary | ICD-10-CM | POA: Diagnosis present

## 2019-02-19 DIAGNOSIS — O99331 Smoking (tobacco) complicating pregnancy, first trimester: Secondary | ICD-10-CM | POA: Insufficient documentation

## 2019-02-19 LAB — TYPE AND SCREEN
ABO/RH(D): O POS
Antibody Screen: NEGATIVE

## 2019-02-19 LAB — CBC
HEMATOCRIT: 40.4 % (ref 36.0–46.0)
Hemoglobin: 12.6 g/dL (ref 12.0–15.0)
MCH: 25.3 pg — ABNORMAL LOW (ref 26.0–34.0)
MCHC: 31.2 g/dL (ref 30.0–36.0)
MCV: 81.1 fL (ref 80.0–100.0)
Platelets: 269 10*3/uL (ref 150–400)
RBC: 4.98 MIL/uL (ref 3.87–5.11)
RDW: 18.2 % — ABNORMAL HIGH (ref 11.5–15.5)
WBC: 9.7 10*3/uL (ref 4.0–10.5)
nRBC: 0 % (ref 0.0–0.2)

## 2019-02-19 LAB — ABO/RH: ABO/RH(D): O POS

## 2019-02-19 LAB — HCG, QUANTITATIVE, PREGNANCY: hCG, Beta Chain, Quant, S: 5823 m[IU]/mL — ABNORMAL HIGH (ref <5)

## 2019-02-19 MED ORDER — SODIUM CHLORIDE 0.9 % IV SOLN
Freq: Once | INTRAVENOUS | Status: AC
  Administered 2019-02-19: 13:00:00 via INTRAVENOUS

## 2019-02-19 NOTE — MAU Provider Note (Signed)
History     CSN: [id number]  Arrival date and time: 02/19/19 1031   First Provider Initiated Contact with Patient 02/19/19 1133      Chief Complaint  Patient presents with  . Abdominal Pain  . Vaginal Bleeding   HPI  Ms.  Pamela Zimmerman is a 33 y.o. year old G40P1102 female at [redacted]w[redacted]d weeks gestation by LMP who transported to MAU waiting room by a [facility] RN reporting heavy VB since this AM, bleeding through her clothes, changing pads frequently, pelvic pain, & SOB. She plans to start [facility] at an OB In [place], [place]. She admits to using heroin 2 days ago.  *Upon arrival to MAU room #123 she was saturated in blood with BP: 65/43 P: 107 and no IV access. Patient states she was not seen by a ED provider and she was told she would be transported to MAU.  Past Medical History:  Diagnosis Date  . Depression    had post partum depressin with last delivery  . Drug abuse (HCC)   . Heroin abuse [facility])     Past Surgical History:  Procedure Laterality Date  . CHOLECYSTECTOMY    . HAND SURGERY Left   . I&D EXTREMITY Right 01/08/2019   Procedure: IRRIGATION AND DEBRIDEMENT, RIGHT HAND;  Surgeon: Mack Hook, MD;  Location: [facility] OR;  Service: Orthopedics;  Laterality: Right;  . LEG SURGERY Left    Growth was removed.  . Right hand      Family History  Problem Relation Age of Onset  . Cancer Mother        lung  . Diabetes Father   . Stroke Father   . Deafness Brother   . Cystic fibrosis Other     Social History   Tobacco Use  . Smoking status: Current Every Day Smoker    Packs/day: 0.50    Types: Cigarettes  . Smokeless tobacco: Never Used  Substance Use Topics  . Alcohol use: No  . Drug use: Yes    Types: Benzodiazepines, IV    Comment: heroin    Allergies: No Known Allergies  No medications prior to admission.    Review of Systems  Constitutional: Positive for fatigue.  HENT: Negative.   Eyes: Negative.   Respiratory: Negative.   Cardiovascular: Negative.    Gastrointestinal: Positive for nausea.  Endocrine: Negative.   Genitourinary: Positive for pelvic pain and vaginal bleeding (changing pads frequently).  Musculoskeletal: Negative.   Skin: Negative.   Allergic/Immunologic: Negative.   Neurological: Positive for dizziness and weakness.  Hematological: Negative.   Psychiatric/Behavioral: Negative.    Physical Exam   Blood pressure (!) 65/43, pulse (!) 107, temperature 98 F (36.7 C), temperature source Oral, resp. rate (!) 22, height 5\' 6"  (1.676 m), weight 56.9 kg, last menstrual period 12/20/2018, SpO2 100 %.  Physical Exam  Nursing note and vitals reviewed. Constitutional: She is oriented to person, place, and time. She appears well-developed and well-nourished.  HENT:  Head: Normocephalic and atraumatic.  Eyes: Pupils are equal, round, and reactive to light.  Neck: Normal range of motion.  Cardiovascular: Regular rhythm, normal heart sounds and intact distal pulses.  Respiratory: Effort normal and breath sounds normal.  GI: Soft. Bowel sounds are normal. There is abdominal tenderness. There is rebound.  Genitourinary:    Genitourinary Comments: Uterus: tender, SE: cervix is smooth, pink, no lesions, copious amt of blood and clots -- removed ~ 100-150 ml of blood clots with 10 large cotton swabs, VE: closed/long/firm,  no CMT or friability, mild bilateral adnexal tenderness // possible POC sent to pathology   Musculoskeletal: Normal range of motion.  Neurological: She is alert and oriented to person, place, and time. She has normal reflexes.  Skin: Skin is warm and dry. There is pallor.  Psychiatric: She has a normal mood and affect. Her behavior is normal. Judgment and thought content normal.    MAU Course  Procedures  MDM CBC ABO/Rh HCG OB <14 wks U/S with TV  Results for orders placed or performed during the hospital encounter of 02/19/19 (from the past 24 hour(s))  CBC     Status: Abnormal   Collection Time: 02/19/19  12:08 PM  Result Value Ref Range   WBC 9.7 4.0 - 10.5 K/uL   RBC 4.98 3.87 - 5.11 MIL/uL   Hemoglobin 12.6 12.0 - 15.0 g/dL   HCT 54.6 50.3 - 54.6 %   MCV 81.1 80.0 - 100.0 fL   MCH 25.3 (L) 26.0 - 34.0 pg   MCHC 31.2 30.0 - 36.0 g/dL   RDW 56.8 (H) 12.7 - 51.7 %   Platelets 269 150 - 400 K/uL   nRBC 0.0 0.0 - 0.2 %  hCG, quantitative, pregnancy     Status: Abnormal   Collection Time: 02/19/19 12:08 PM  Result Value Ref Range   hCG, Beta Chain, Quant, S 5,823 (H) <5 mIU/mL  Type and screen [facility]     Status: None   Collection Time: 02/19/19 12:09 PM  Result Value Ref Range   ABO/RH(D) O POS    Antibody Screen NEG    Sample Expiration      02/22/2019 Performed at [facility], [address], [place], [place] [address]   ABO/Rh     Status: None   Collection Time: 02/19/19 12:09 PM  Result Value Ref Range   ABO/RH(D)      O POS Performed at [facility], [address] [place], [place] [address]    US Ob Less Than 14 Weeks With Ob Transvaginal  Result Date: 02/19/2019 CLINICAL DATA:  Bleeding and early pregnancy. EXAM: OBSTETRIC <14 WK [place] AND TRANSVAGINAL OB US TECHNIQUE: Both transabdominal and transvaginal ultrasound examinations were performed for complete evaluation of the gestation as well as the maternal uterus, adnexal regions, and pelvic cul-de-sac. Transvaginal technique was performed to assess early pregnancy. COMPARISON:  None. FINDINGS: Intrauterine gestational sac: Single, irregular and configuration with several bandlike internal areas of septation. Yolk sac:  None Embryo:  No MSD: 11.18 mm   5 w 6 d Maternal uterus/adnexae: Subchorionic hemorrhage: None Right ovary: Normal Left ovary: Normal Other :The endometrium has a thickened and heterogeneous appearance measuring 22.8 mm. Active, heavy bleeding during the examination was visualized by the sonographer. Free fluid:  None IMPRESSION: 1. Probable early intrauterine gestational sac,  but no yolk sac, fetal pole, or cardiac activity yet visualized. Recommend follow-up quantitative B-HCG levels and follow-up US in 14 days to assess viability. This recommendation follows SRU consensus guidelines: Diagnostic Criteria for Nonviable Pregnancy Early in the First Trimester. Malva Limes Med 2013; 759:1638-46. 2. The endometrial cavity has a irregular configuration with several echogenic bandlike filling defects. Additionally, the endometrium is thickened and heterogeneous in appearance. Abortion in progress can not be excluded. Electronically Signed   By: Signa Kell M.D.   On: 02/19/2019 13:17    Assessment and Plan  Threatened miscarriage in early pregnancy  - Information provided on threatened miscarriage  -  Advised will need to f/u with [facility] for rpt HCG  Bleeding in early pregnancy  - Return to MAU:   If you have heavier bleeding that soaks through more that 2 pads per hour for an hour or more  If you bleed so much that you feel like you might pass out or you do pass out  If you have significant abdominal pain that is not improved with Tylenol   If you develop a fever > 100.5  Abdominal pain affecting pregnancy  - Information provided on abd pain pregnancy  - Discharge home - F/U appt for rpt HCG scheduled for Tuesday 02/20/2019 @ 1330; advised to plan to wait at least 2 hrs for results   Raelyn Mora, MSN, CNM 02/19/2019, 11:33 AM

## 2019-02-19 NOTE — ED Notes (Signed)
MAU contacted and notified about pt. Pt has positive pregnancy test from 01/18 and thinks she is approximately [redacted] weeks pregnant. MAU to accept pt, pt escorted to MAU by this RN and EMT via wheelchair. Report given by this RN to MAU RN.

## 2019-02-19 NOTE — MAU Note (Signed)
Pamela Zimmerman is a 33 y.o. at [redacted]w[redacted]d here in MAU reporting: vaginal bleeding since this AM and cramping. Changing pads very frequently. Pt bleeding through clothes onto towel. Pt states it is getting hard to breathe.  Pain score: 6/10  Vitals:   02/19/19 1105  BP: (!) 65/43  Pulse: (!) 107  Resp: (!) 22  Temp: 98 F (36.7 C)  SpO2: 100%      Lab orders placed from triage: none

## 2019-02-19 NOTE — MAU Note (Signed)
Pt is  G3P2 at 8.5 weeks stating that at 0800 she began bleeding heavy with clots larger than an egg.  Pt is a current heroin user that states she uses often enough to not "feel sick".

## 2019-02-21 ENCOUNTER — Telehealth: Payer: Self-pay

## 2019-02-21 ENCOUNTER — Ambulatory Visit: Payer: Self-pay

## 2019-02-21 NOTE — Telephone Encounter (Addendum)
-----   Message from Tereso Newcomer, MD sent at 02/21/2019  1:55 PM EST ----- 02/19/2019 Pathology Diagnosis Products of Conception - CHORIONIC VILLI CONSISTENT WITH PRODUCTS OF CONCEPTION. She passed some if not all of POCs during the bleeding episode; had miscarriage (could be partial or total). Definite IUP, no need for HCG to be stat, will just make sure it is dropping appropriately  and make sure nothing is left. Message also routed to [facility] clinical pool.  Jaynie Collins, MD  Notified pt of results and the need for non-stat beta level.  Pt stated that she would be able to come in on Thursday 02/23/19 @ 1500 for the beta lab and did not have any other questions.

## 2019-02-21 NOTE — Progress Notes (Signed)
02/19/2019 Pathology Diagnosis Products of Conception - CHORIONIC VILLI CONSISTENT WITH PRODUCTS OF CONCEPTION. She passed some if not all of POCs during the bleeding episode; had miscarriage (could be partial or total). Definite IUP, no need for HCG to be stat, will just make sure it is dropping appropriately  and make sure nothing is left. Message also routed to [facility] clinical pool.  Jaynie Collins, MD

## 2019-09-15 DIAGNOSIS — L03116 Cellulitis of left lower limb: Secondary | ICD-10-CM | POA: Insufficient documentation

## 2019-09-15 DIAGNOSIS — O00101 Right tubal pregnancy without intrauterine pregnancy: Secondary | ICD-10-CM | POA: Insufficient documentation

## 2019-09-15 DIAGNOSIS — L02416 Cutaneous abscess of left lower limb: Secondary | ICD-10-CM | POA: Insufficient documentation

## 2019-09-16 DIAGNOSIS — L03113 Cellulitis of right upper limb: Secondary | ICD-10-CM | POA: Insufficient documentation

## 2019-09-16 DIAGNOSIS — R062 Wheezing: Secondary | ICD-10-CM | POA: Insufficient documentation

## 2019-09-16 DIAGNOSIS — R718 Other abnormality of red blood cells: Secondary | ICD-10-CM | POA: Insufficient documentation

## 2019-09-16 DIAGNOSIS — D72829 Elevated white blood cell count, unspecified: Secondary | ICD-10-CM | POA: Insufficient documentation

## 2019-09-16 DIAGNOSIS — F1120 Opioid dependence, uncomplicated: Secondary | ICD-10-CM | POA: Insufficient documentation

## 2019-09-16 DIAGNOSIS — E871 Hypo-osmolality and hyponatremia: Secondary | ICD-10-CM | POA: Insufficient documentation

## 2019-09-16 DIAGNOSIS — R300 Dysuria: Secondary | ICD-10-CM | POA: Insufficient documentation

## 2019-09-16 DIAGNOSIS — B182 Chronic viral hepatitis C: Secondary | ICD-10-CM | POA: Insufficient documentation

## 2019-10-12 DIAGNOSIS — R7881 Bacteremia: Secondary | ICD-10-CM | POA: Insufficient documentation

## 2020-04-05 ENCOUNTER — Other Ambulatory Visit: Payer: Self-pay

## 2020-04-05 ENCOUNTER — Ambulatory Visit (INDEPENDENT_AMBULATORY_CARE_PROVIDER_SITE_OTHER)
Admission: EM | Admit: 2020-04-05 | Discharge: 2020-04-05 | Disposition: A | Discharge status: Home / Self Care | Payer: PRIVATE HEALTH INSURANCE | Source: Home / Self Care

## 2020-04-05 ENCOUNTER — Emergency Department (HOSPITAL_COMMUNITY): Payer: PRIVATE HEALTH INSURANCE

## 2020-04-05 ENCOUNTER — Encounter (HOSPITAL_COMMUNITY): Payer: Self-pay | Admitting: *Deleted

## 2020-04-05 ENCOUNTER — Emergency Department (HOSPITAL_COMMUNITY)
Admission: EM | Admit: 2020-04-05 | Discharge: 2020-04-05 | Disposition: A | Discharge status: Home / Self Care | Payer: PRIVATE HEALTH INSURANCE | Attending: Emergency Medicine | Admitting: Emergency Medicine

## 2020-04-05 DIAGNOSIS — F1910 Other psychoactive substance abuse, uncomplicated: Secondary | ICD-10-CM

## 2020-04-05 DIAGNOSIS — F17210 Nicotine dependence, cigarettes, uncomplicated: Secondary | ICD-10-CM | POA: Diagnosis not present

## 2020-04-05 DIAGNOSIS — R000 Tachycardia, unspecified: Secondary | ICD-10-CM | POA: Diagnosis not present

## 2020-04-05 DIAGNOSIS — R002 Palpitations: Secondary | ICD-10-CM | POA: Diagnosis not present

## 2020-04-05 DIAGNOSIS — I10 Essential (primary) hypertension: Secondary | ICD-10-CM | POA: Diagnosis present

## 2020-04-05 DIAGNOSIS — R0789 Other chest pain: Secondary | ICD-10-CM | POA: Diagnosis not present

## 2020-04-05 LAB — CBC
HCT: 42.1 % (ref 36.0–46.0)
Hemoglobin: 13.4 g/dL (ref 12.0–15.0)
MCH: 26.2 pg (ref 26.0–34.0)
MCHC: 31.8 g/dL (ref 30.0–36.0)
MCV: 82.4 fL (ref 80.0–100.0)
Platelets: 251 10*3/uL (ref 150–400)
RBC: 5.11 MIL/uL (ref 3.87–5.11)
RDW: 17.2 % — ABNORMAL HIGH (ref 11.5–15.5)
WBC: 7.1 10*3/uL (ref 4.0–10.5)
nRBC: 0 % (ref 0.0–0.2)

## 2020-04-05 LAB — BASIC METABOLIC PANEL
Anion gap: 9 (ref 5–15)
BUN: 14 mg/dL (ref 6–20)
CO2: 25 mmol/L (ref 22–32)
Calcium: 9.2 mg/dL (ref 8.9–10.3)
Chloride: 104 mmol/L (ref 98–111)
Creatinine, Ser: 0.83 mg/dL (ref 0.44–1.00)
GFR calc Af Amer: >60 mL/min (ref >60)
GFR calc non Af Amer: >60 mL/min (ref >60)
Glucose, Bld: 98 mg/dL (ref 70–99)
Potassium: 4.2 mmol/L (ref 3.5–5.1)
Sodium: 138 mmol/L (ref 135–145)

## 2020-04-05 LAB — TROPONIN I (HIGH SENSITIVITY): Troponin I (High Sensitivity): <2 ng/L (ref <18)

## 2020-04-05 LAB — TSH: TSH: 0.451 u[IU]/mL (ref 0.350–4.500)

## 2020-04-05 LAB — D-DIMER, QUANTITATIVE: D-Dimer, Quant: 0.47 ug/mL-FEU (ref 0.00–0.50)

## 2020-04-05 MED ORDER — SODIUM CHLORIDE 0.9 % IV BOLUS
1000.0000 mL | Freq: Once | INTRAVENOUS | Status: AC
  Administered 2020-04-05: 1000 mL via INTRAVENOUS

## 2020-04-05 NOTE — ED Provider Notes (Signed)
33 year old female comes in for jitteriness, hot flashes, chest pressure after injecting fentanyl around 1030 this morning.  States she feels that her blood pressure is high. Has not used heroin in months. Took buspar tabs in the past few days.   Patient was tachycardic at 138 at triage, RR 18, O2 sat 97%  Constitutional: nontoxic, sitting comfortably.  Eyes: clear conjunctiva, PERRL Heart: tachycardic, regular rhythm Lungs: LCTAB Neuro: alert and oriented.   Discussed case with Dr Leonides Grills. Substance use 10:30 this morning, patient states fentanyl, but unsure if other medications were added. She does not show signs of respiratory depression, no confusion, PERRL. No need for naloxone at this time. However, she is tachycardic in the 130s-140s. EKG sinus tachycardia, 131bpm, no significant ST changes. Due to tachycardia, substance abuse, Dr Leonides Grills suggested ED evaluation and workup. Discussed with patient, who deferred EMS transport. States will have family member transport to ED for further evaluation.   Belinda Fisher, PA-C 04/05/20 1349

## 2020-04-05 NOTE — Discharge Instructions (Signed)
Please follow up with a primary doctor Return if you are worsening

## 2020-04-05 NOTE — ED Provider Notes (Signed)
[facility]-EMERGENCY DEPT Provider Note   CSN: [id number] Arrival date & time: 04/05/20  1447     History Chief Complaint  Patient presents with  . Hypertension  . Tachycardia    Pamela Zimmerman is a 34 y.o. female with history of IVDU who presents with HTN and tachycardia.  She injected fentanyl around 1030 this morning and then started to feel like she had hot flashes, was jittery, anxious, and had left-sided chest pressure and palpitations.  She states that she freaked out went to urgent care and due to significantly elevated heart rate and mildly elevated blood pressure she was referred to the ED.  Her heart rate in urgent care was 138.  Patient denies fever, chills, chest pain, shortness of breath, cough, abdominal pain, nausea, vomiting, diarrhea, urinary symptoms.  She states that she is not inject IV drugs often has not used in months.  The left-sided chest pressure and palpitations are improving.  She states she feels very stressed and anxious because she lost her boyfriend and started a new job and had a recent move. No recent surgery/travel/immobilization, hx of cancer, leg swelling, hemoptysis, prior DVT/PE, or hormone use.  HPI     Past Medical History:  Diagnosis Date  . Depression    had post partum depressin with last delivery  . Drug abuse (HCC)   . Heroin abuse [facility])     Patient Active Problem List   Diagnosis Date Noted  . Threatened miscarriage in early pregnancy 02/19/2019  . Substance abuse affecting pregnancy in first trimester, antepartum 02/19/2019  . Cellulitis 01/08/2019  . IVDA (intravenous drug abuse) complicating pregnancy (HCC)   . Puncture wound of right hand with infection 01/07/2019  . Heroin abuse (HCC) 05/04/2018  . Current smoker 01/05/2017  . Pregnancy 03/17/2013  . Depression 03/02/2013    Past Surgical History:  Procedure Laterality Date  . CHOLECYSTECTOMY    . HAND SURGERY Left   . I & D EXTREMITY Right  01/08/2019   Procedure: IRRIGATION AND DEBRIDEMENT, RIGHT HAND;  Surgeon: Mack Hook, MD;  Location: [facility] OR;  Service: Orthopedics;  Laterality: Right;  . LEG SURGERY Left    Growth was removed.  . Right hand       OB History    Gravida  3   Para  2   Term  1   Preterm  1   AB      Living  2     SAB      TAB      Ectopic      Multiple      Live Births  2           Family History  Problem Relation Age of Onset  . Cancer Mother        lung  . Diabetes Father   . Stroke Father   . Deafness Brother   . Cystic fibrosis Other     Social History   Tobacco Use  . Smoking status: Current Every Day Smoker    Packs/day: 0.50    Types: Cigarettes  . Smokeless tobacco: Never Used  Substance Use Topics  . Alcohol use: No  . Drug use: Yes    Types: Benzodiazepines, IV    Comment: heroin, Fentanyl    Home Medications Prior to Admission medications   Not on File    Allergies    Patient has no known allergies.  Review of Systems   Review of Systems  Constitutional: Negative for chills and fever.  Respiratory: Negative for shortness of breath.   Cardiovascular: Positive for chest pain (pressure) and palpitations.  Gastrointestinal: Negative for abdominal pain, constipation, diarrhea, nausea and vomiting.  Genitourinary: Negative for dysuria and flank pain.  All other systems reviewed and are negative.   Physical Exam Updated Vital Signs BP (!) 127/93 (BP Location: Left Arm)   Pulse (!) 132   Temp 98.1 F (36.7 C) (Oral)   Ht 5\' 6"  (1.676 m)   Wt 61.7 kg   LMP 03/26/2020   SpO2 97%   BMI 21.95 kg/m   Physical Exam Vitals and nursing note reviewed.  Constitutional:      General: She is not in acute distress.    Appearance: Normal appearance. She is well-developed. She is not ill-appearing.  HENT:     Head: Normocephalic and atraumatic.  Eyes:     General: No scleral icterus.       Right eye: No discharge.        Left eye: No  discharge.     Conjunctiva/sclera: Conjunctivae normal.     Pupils: Pupils are equal, round, and reactive to light.  Cardiovascular:     Rate and Rhythm: Regular rhythm. Tachycardia present.  Pulmonary:     Effort: Pulmonary effort is normal. No respiratory distress.     Breath sounds: Normal breath sounds.  Abdominal:     General: There is no distension.     Palpations: Abdomen is soft.     Tenderness: There is no abdominal tenderness.  Musculoskeletal:     Cervical back: Normal range of motion.  Skin:    General: Skin is warm and dry.  Neurological:     Mental Status: She is alert and oriented to person, place, and time.  Psychiatric:        Mood and Affect: Mood is anxious.        Behavior: Behavior normal.     ED Results / Procedures / Treatments   Labs (all labs ordered are listed, but only abnormal results are displayed) Labs Reviewed  CBC - Abnormal; Notable for the following components:      Result Value   RDW 17.2 (*)    All other components within normal limits  BASIC METABOLIC PANEL  TSH  D-DIMER, QUANTITATIVE (NOT AT [facility])  TROPONIN I (HIGH SENSITIVITY)  TROPONIN I (HIGH SENSITIVITY)    EKG EKG Interpretation  Date/Time:  Friday April 05 2020 15:03:33 EDT Ventricular Rate:  125 PR Interval:    QRS Duration: 70 QT Interval:  289 QTC Calculation: 417 R Axis:   77 Text Interpretation: Sinus tachycardia Prominent P waves, nondiagnostic Borderline repolarization abnormality 12 Lead; Mason-L[place]ar No prior ECG for comparison. No STEMI Confirmed by Antony Blackbird [phone]) on 04/05/2020 6:35:38 PM   Radiology DG Chest Port 1 View  Result Date: 04/05/2020 CLINICAL DATA:  Drug abuse, hypertension, chest pressure, tobacco abuse EXAM: PORTABLE CHEST 1 VIEW COMPARISON:  09/16/2019 FINDINGS: The heart size and mediastinal contours are within normal limits. Both lungs are clear. The visualized skeletal structures are unremarkable. IMPRESSION: No active disease.  Electronically Signed   By: Randa Ngo M.D.   On: 04/05/2020 19:20    Procedures Procedures (including critical care time)  Medications Ordered in ED Medications  sodium chloride 0.9 % bolus 1,000 mL (1,000 mLs Intravenous New Bag/Given 04/05/20 1955)    ED Course  I have reviewed the triage vital signs and the nursing notes.  Pertinent labs & imaging  results that were available during my care of the patient were reviewed by me and considered in my medical decision making (see chart for details).  34 year old female presents with tachycardia and hypertension after injecting what she thought was fentanyl this morning.  On exam she is mildly anxious but alert and cooperative.  Heart rate is fast and regular.  Lungs are clear to auscultation.  Abdomen is soft and nontender.  There is no lower extremity edema.  EKG obtained in triage shows sinus tachycardia.  Will obtain labs, troponin, D-dimer, TSH, chest x-ray.  Will give 1 L of fluids.  Labs are grossly reassuring.  Troponin is undetectable.  D-dimer is normal.  TSH is normal.  Do not feel we need to keep her for a second troponin since symptoms are atypical for ACS.  Her chest x-ray is negative.  Rechecked patient and her tachycardia has resolved.  She is requesting discharge.  We discussed possibly obtaining a UDS to see if she possibly injected a different drug however she is declining this and states that she is ready to leave.  MDM Rules/Calculators/A&P                       Final Clinical Impression(s) / ED Diagnoses Final diagnoses:  Tachycardia  Left chest pressure  Palpitations    Rx / DC Orders ED Discharge Orders    None       Bethel Born, PA-C 04/05/20 2101    Tegeler, Canary Brim, MD 04/06/20 [phone]

## 2020-04-05 NOTE — ED Triage Notes (Signed)
Pt states she injected fentanyl around 1030, states her b/p feels high and feels jittery. States now having hot flashes and chest pressure.

## 2020-04-05 NOTE — ED Triage Notes (Signed)
Pt sent from UC due to HTN and tachycardia after injecting Fentanyl around 1030 this am. In triage HR 131-bp 127/93

## 2020-09-22 ENCOUNTER — Emergency Department (HOSPITAL_COMMUNITY): Payer: Self-pay

## 2020-09-22 ENCOUNTER — Emergency Department (HOSPITAL_COMMUNITY)
Admission: EM | Admit: 2020-09-22 | Discharge: 2020-09-22 | Disposition: A | Discharge status: Home / Self Care | Payer: Self-pay | Attending: Emergency Medicine | Admitting: Emergency Medicine

## 2020-09-22 ENCOUNTER — Encounter (HOSPITAL_COMMUNITY): Payer: Self-pay

## 2020-09-22 DIAGNOSIS — F17210 Nicotine dependence, cigarettes, uncomplicated: Secondary | ICD-10-CM | POA: Insufficient documentation

## 2020-09-22 DIAGNOSIS — R519 Headache, unspecified: Secondary | ICD-10-CM | POA: Insufficient documentation

## 2020-09-22 DIAGNOSIS — F19929 Other psychoactive substance use, unspecified with intoxication, unspecified: Secondary | ICD-10-CM | POA: Insufficient documentation

## 2020-09-22 DIAGNOSIS — R21 Rash and other nonspecific skin eruption: Secondary | ICD-10-CM | POA: Insufficient documentation

## 2020-09-22 MED ORDER — IBUPROFEN 600 MG PO TABS: 600.0000 mg | ORAL_TABLET | Freq: Four times a day (QID) | ORAL | 0 refills | Status: AC | PRN

## 2020-09-22 MED ORDER — BACITRACIN ZINC 500 UNIT/GM EX OINT: 1.0000 "application " | TOPICAL_OINTMENT | Freq: Two times a day (BID) | CUTANEOUS | 0 refills | Status: AC

## 2020-09-22 MED ORDER — CEPHALEXIN 500 MG PO CAPS: 500.0000 mg | ORAL_CAPSULE | Freq: Two times a day (BID) | ORAL | 0 refills | Status: AC

## 2020-09-22 MED ORDER — BACITRACIN ZINC 500 UNIT/GM EX OINT
TOPICAL_OINTMENT | Freq: Two times a day (BID) | CUTANEOUS | Status: DC
  Administered 2020-09-22: 1 via TOPICAL
  Filled 2020-09-22: qty 0.9

## 2020-09-22 MED ORDER — IBUPROFEN 400 MG PO TABS
600.0000 mg | ORAL_TABLET | Freq: Once | ORAL | Status: AC
  Administered 2020-09-22: 600 mg via ORAL
  Filled 2020-09-22: qty 1

## 2020-09-22 MED ORDER — ACETAMINOPHEN 325 MG PO TABS
650.0000 mg | ORAL_TABLET | Freq: Once | ORAL | Status: AC
  Administered 2020-09-22: 650 mg via ORAL
  Filled 2020-09-22: qty 2

## 2020-09-22 MED ORDER — LORAZEPAM 1 MG PO TABS
1.0000 mg | ORAL_TABLET | Freq: Once | ORAL | Status: AC
  Administered 2020-09-22: 1 mg via ORAL
  Filled 2020-09-22: qty 1

## 2020-09-22 NOTE — ED Notes (Signed)
R arm wrapped with telfa and kerlex

## 2020-09-22 NOTE — Discharge Instructions (Addendum)
You have some wounds and rashes on your body.  These should heal over the next 2 weeks.  You will be very sore for the next few days.  You can take motrin for pain.  I also prescribed Keflex, an antibiotic, to prevent skin infection.  Start taking this tomorrow morning.    You can put some bacitracin ointment on your wounds too, to prevent infection, for the next 7 days.  You can put this on your right arm especially.  Please avoid using drugs in the future.

## 2020-09-22 NOTE — ED Triage Notes (Addendum)
Assume care from EMS, Per EMS pt was found lying [place] a ditch in a [place]ighborhood . Ems reports abrasions on both arm and right shoulder radiating to back. EMS reports pt stated taken molly and having eradic behavior but easily redirected. EMS reports no head trauma noted   126/80 Heart rate 80 RR- 18 O2 sats 98%  CBG 126
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
## 2020-09-28 ENCOUNTER — Other Ambulatory Visit: Payer: Self-pay

## 2020-09-28 DIAGNOSIS — Z5321 Procedure and treatment not carried out due to patient leaving prior to being seen by health care provider: Secondary | ICD-10-CM | POA: Insufficient documentation

## 2020-09-28 DIAGNOSIS — R519 Headache, unspecified: Secondary | ICD-10-CM | POA: Insufficient documentation

## 2020-09-29 ENCOUNTER — Emergency Department (HOSPITAL_COMMUNITY)
Admission: EM | Admit: 2020-09-29 | Discharge: 2020-09-29 | Disposition: A | Discharge status: Home / Self Care | Payer: Self-pay | Attending: Emergency Medicine | Admitting: Emergency Medicine

## 2020-09-29 ENCOUNTER — Other Ambulatory Visit: Payer: Self-pay

## 2020-09-29 ENCOUNTER — Encounter (HOSPITAL_COMMUNITY): Payer: Self-pay | Admitting: Emergency Medicine

## 2020-09-29 DIAGNOSIS — M549 Dorsalgia, unspecified: Secondary | ICD-10-CM | POA: Insufficient documentation

## 2020-09-29 DIAGNOSIS — R519 Headache, unspecified: Secondary | ICD-10-CM | POA: Insufficient documentation

## 2020-09-29 DIAGNOSIS — Z5321 Procedure and treatment not carried out due to patient leaving prior to being seen by health care provider: Secondary | ICD-10-CM | POA: Insufficient documentation

## 2020-09-29 NOTE — ED Triage Notes (Signed)
Pt to ED with c/o headache, hurting all over   Pt sts she is coming off of heroin and fentanyl   Last used 2 days ago

## 2020-09-29 NOTE — ED Notes (Signed)
Pt didn't answer when called for vitals  °

## 2020-09-29 NOTE — ED Triage Notes (Signed)
Pt c/o generalized back pain and headache x 2-3 days.  States she is detoxing from fentanyl and heroin.  States she stopped "cold [place]" 2-3 days ago.  Pt checked in just after midnight this AM and LWBS.

## 2020-09-29 NOTE — ED Notes (Signed)
Pt didn't answer when called to recheck vitals x 2

## 2020-09-29 NOTE — ED Notes (Signed)
Called pt multiple times for vitals recheck no answer  °

## 2020-09-29 NOTE — ED Notes (Signed)
Pt didn't answer for vitals x3 . Left without being seen

## 2021-06-20 ENCOUNTER — Ambulatory Visit
Admission: EM | Admit: 2021-06-20 | Discharge: 2021-06-20 | Disposition: A | Discharge status: Home / Self Care | Payer: 59 | Attending: Family Medicine | Admitting: Family Medicine

## 2021-06-20 ENCOUNTER — Other Ambulatory Visit: Payer: Self-pay

## 2021-06-20 DIAGNOSIS — R2243 Localized swelling, mass and lump, lower limb, bilateral: Secondary | ICD-10-CM | POA: Diagnosis not present

## 2021-06-20 DIAGNOSIS — F1911 Other psychoactive substance abuse, in remission: Secondary | ICD-10-CM

## 2021-06-20 HISTORY — DX: Unspecified viral hepatitis C without hepatic coma: B19.20

## 2021-06-20 LAB — POCT URINALYSIS DIP (MANUAL ENTRY)
Bilirubin, UA: NEGATIVE
Blood, UA: NEGATIVE
Glucose, UA: NEGATIVE mg/dL
Leukocytes, UA: NEGATIVE
Nitrite, UA: NEGATIVE
Protein Ur, POC: NEGATIVE mg/dL
Spec Grav, UA: 1.025 (ref 1.010–1.025)
Urobilinogen, UA: 1 E.U./dL
pH, UA: 7 (ref 5.0–8.0)

## 2021-06-20 LAB — POCT URINE PREGNANCY: Preg Test, Ur: NEGATIVE

## 2021-06-20 NOTE — ED Provider Notes (Signed)
[name]    CSN: [id number] Arrival date & time: 06/20/21  1100      History   Chief Complaint Chief Complaint  Patient presents with   Foot Swelling    HPI Pamela Zimmerman is a 35 y.o. female.   Reports that she has had bilateral lower extremity swelling for the last 5 days.  Reports that she normally works 17 hours on her feet.  Wears low-cut socks with no suppression support.  Denies previous symptoms.  Has not attempted OTC treatment.  Reports that for the first 2 to 3 days, the swelling will go down when she elevated her feet at night, but that the last 2 days it has not happened.  Reports that she is having pain in the ankles with walking due to the swelling.  States that she is concerned about her kidneys working well today.  Has history of IV drug use and substance abuse, mention possible hepatitis in the past, but she is not sure.  She is a current daily smoker.  Denies headache, abdominal pain, nausea, vomiting, diarrhea, rash, shortness of breath, dizziness, fever, other symptoms.  ROS per HPI  The history is provided by the patient.   Past Medical History:  Diagnosis Date   Depression    had post partum depressin with last delivery   Drug abuse (HCC)    Heroin abuse [facility])     Patient Active Problem List   Diagnosis Date Noted   Threatened miscarriage in early pregnancy 02/19/2019   Substance abuse affecting pregnancy in first trimester, antepartum 02/19/2019   Cellulitis 01/08/2019   IVDA (intravenous drug abuse) complicating pregnancy (HCC)    Puncture wound of right hand with infection 01/07/2019   Heroin abuse (HCC) 05/04/2018   Current smoker 01/05/2017   Pregnancy 03/17/2013   Depression 03/02/2013    Past Surgical History:  Procedure Laterality Date   CHOLECYSTECTOMY     HAND SURGERY Left    I & D EXTREMITY Right 01/08/2019   Procedure: IRRIGATION AND DEBRIDEMENT, RIGHT HAND;  Surgeon: Mack Hook, MD;  Location: [facility] OR;  Service:  Orthopedics;  Laterality: Right;   LEG SURGERY Left    Growth was removed.   Right hand      OB History     Gravida  3   Para  2   Term  1   Preterm  1   AB      Living  2      SAB      IAB      Ectopic      Multiple      Live Births  2            Home Medications    Prior to Admission medications   Medication Sig Start Date End Date Taking? Authorizing Provider  Ascorbic Acid (VITAMIN C) 100 MG tablet Take 100 mg by mouth daily.    [provider]  ibuprofen (ADVIL) 600 MG tablet Take 1 tablet (600 mg total) by mouth every 6 (six) hours as needed for up to 30 doses for mild pain or moderate pain. 09/22/20   Terald Sleeper, MD  Multiple Vitamin (MULTIVITAMIN) tablet Take 1 tablet by mouth daily.    [provider]    Family History Family History  Problem Relation Age of Onset   Cancer Mother        lung   Diabetes Father    Stroke Father    Deafness  Brother    Cystic fibrosis Other     Social History Social History   Tobacco Use   Smoking status: Every Day    Packs/day: 0.50    Pack years: 0.00    Types: Cigarettes   Smokeless tobacco: Never  Vaping Use   Vaping Use: Never used  Substance Use Topics   Alcohol use: No   Drug use: Yes    Types: Benzodiazepines, IV    Comment: heroin, Fentanyl     Allergies   Patient has no known allergies.   Review of Systems Review of Systems   Physical Exam Triage Vital Signs ED Triage Vitals  Enc Vitals Group     BP 06/20/21 1149 96/68     Pulse Rate 06/20/21 1149 87     Resp 06/20/21 1149 16     Temp 06/20/21 1149 98.1 F (36.7 C)     Temp Source 06/20/21 1149 Oral     SpO2 06/20/21 1149 96 %     Weight --      Height --      Head Circumference --      Peak Flow --      Pain Score 06/20/21 1150 6     Pain Loc --      Pain Edu? --      Excl. in GC? --    No data found.  Updated Vital Signs BP 96/68 (BP Location: Left Arm)   Pulse 87   Temp 98.1 F (36.7  C) (Oral)   Resp 16   SpO2 96%       Physical Exam Vitals and nursing note reviewed.  Constitutional:      General: She is not in acute distress.    Appearance: Normal appearance. She is well-developed.  HENT:     Head: Normocephalic and atraumatic.     Nose: Nose normal.     Mouth/Throat:     Mouth: Mucous membranes are moist.     Pharynx: Oropharynx is clear.  Eyes:     Extraocular Movements: Extraocular movements intact.     Conjunctiva/sclera: Conjunctivae normal.     Pupils: Pupils are equal, round, and reactive to light.  Cardiovascular:     Rate and Rhythm: Normal rate and regular rhythm.  Pulmonary:     Effort: Pulmonary effort is normal. No respiratory distress.  Musculoskeletal:        General: Normal range of motion.     Cervical back: Normal range of motion and neck supple.     Right lower leg: Edema present.     Left lower leg: Edema present.  Skin:    General: Skin is warm and dry.     Capillary Refill: Capillary refill takes less than 2 seconds.  Neurological:     General: No focal deficit present.     Mental Status: She is alert and oriented to person, place, and time.  Psychiatric:        Mood and Affect: Mood normal.        Behavior: Behavior normal.        Thought Content: Thought content normal.     UC Treatments / Results  Labs (all labs ordered are listed, but only abnormal results are displayed) Labs Reviewed  POCT URINALYSIS DIP (MANUAL ENTRY) - Abnormal; Notable for the following components:      Result Value   Ketones, POC UA trace (5) (*)    All other components within normal limits  COMPREHENSIVE METABOLIC PANEL  CBC WITH DIFFERENTIAL/PLATELET  POCT URINE PREGNANCY    EKG   Radiology No results found.  Procedures Procedures (including critical care time)  Medications Ordered in UC Medications - No data to display  Initial Impression / Assessment and Plan / UC Course  I have reviewed the triage vital signs and the  nursing notes.  Pertinent labs & imaging results that were available during my care of the patient were reviewed by me and considered in my medical decision making (see chart for details).    Localized swelling of both lower legs History of substance abuse  Urine in office negative for infection Urine pregnancy in office negative CBC, CMP drawn today Will be in touch with abnormal results EKG reassuring in office today May use compression socks when you are up and on your feet May elevate your feet when you are resting May take ibuprofen and Tylenol as needed for pain May use ice to the area as needed for comfort Follow up with this office or with primary care if symptoms are persisting.  Follow up in the ER for high fever, trouble swallowing, trouble breathing, other concerning symptoms.   Final Clinical Impressions(s) / UC Diagnoses   Final diagnoses:  Localized swelling of both lower legs  History of substance abuse [facility])     Discharge Instructions      Urine pregnancy test is negative in the office today  Urine is negative for infection today  Your EKG looks good today  We have drawn some labs, we will be in touch with any abnormal results that require further attention  In the meantime, may wear compression socks, elevate your feet  Follow up with this office or with primary care if symptoms are persisting.  Follow up in the ER for high fever, trouble swallowing, trouble breathing, other concerning symptoms.      ED Prescriptions   None    PDMP not reviewed this encounter.   Moshe Cipro, NP 06/20/21 1301

## 2021-06-20 NOTE — Discharge Instructions (Addendum)
Urine pregnancy test is negative in the office today  Urine is negative for infection today  Your EKG looks good today  We have drawn some labs, we will be in touch with any abnormal results that require further attention  In the meantime, may wear compression socks, elevate your feet  Follow up with this office or with primary care if symptoms are persisting.  Follow up in the ER for high fever, trouble swallowing, trouble breathing, other concerning symptoms.

## 2021-06-20 NOTE — ED Triage Notes (Signed)
Pt present left and right foot swelling, symptoms started five days ago. Pt states she works 17 hours on her feet.

## 2021-06-21 ENCOUNTER — Encounter: Payer: Self-pay | Admitting: Emergency Medicine

## 2021-06-21 LAB — COMPREHENSIVE METABOLIC PANEL
ALT: 68 IU/L — ABNORMAL HIGH (ref 0–32)
AST: 61 IU/L — ABNORMAL HIGH (ref 0–40)
Albumin/Globulin Ratio: 1.6 (ref 1.2–2.2)
Albumin: 4 g/dL (ref 3.8–4.8)
Alkaline Phosphatase: 68 IU/L (ref 44–121)
BUN/Creatinine Ratio: 9 (ref 9–23)
BUN: 7 mg/dL (ref 6–20)
Bilirubin Total: 0.5 mg/dL (ref 0.0–1.2)
CO2: 21 mmol/L (ref 20–29)
Calcium: 9.5 mg/dL (ref 8.7–10.2)
Chloride: 102 mmol/L (ref 96–106)
Creatinine, Ser: 0.76 mg/dL (ref 0.57–1.00)
Globulin, Total: 2.5 g/dL (ref 1.5–4.5)
Glucose: 104 mg/dL — ABNORMAL HIGH (ref 65–99)
Potassium: 4.7 mmol/L (ref 3.5–5.2)
Sodium: 143 mmol/L (ref 134–144)
Total Protein: 6.5 g/dL (ref 6.0–8.5)
eGFR: 104 mL/min/{1.73_m2} (ref >59)

## 2021-06-21 LAB — CBC WITH DIFFERENTIAL/PLATELET
Basophils Absolute: 0 10*3/uL (ref 0.0–0.2)
Basos: 1 %
EOS (ABSOLUTE): 0.2 10*3/uL (ref 0.0–0.4)
Eos: 3 %
Hematocrit: 43.7 % (ref 34.0–46.6)
Hemoglobin: 14.8 g/dL (ref 11.1–15.9)
Immature Grans (Abs): 0 10*3/uL (ref 0.0–0.1)
Immature Granulocytes: 0 %
Lymphocytes Absolute: 2 10*3/uL (ref 0.7–3.1)
Lymphs: 36 %
MCH: 29.8 pg (ref 26.6–33.0)
MCHC: 33.9 g/dL (ref 31.5–35.7)
MCV: 88 fL (ref 79–97)
Monocytes Absolute: 0.6 10*3/uL (ref 0.1–0.9)
Monocytes: 11 %
Neutrophils Absolute: 2.8 10*3/uL (ref 1.4–7.0)
Neutrophils: 49 %
Platelets: 217 10*3/uL (ref 150–450)
RBC: 4.97 x10E6/uL (ref 3.77–5.28)
RDW: 12.8 % (ref 11.7–15.4)
WBC: 5.6 10*3/uL (ref 3.4–10.8)
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
## 2022-01-10 ENCOUNTER — Ambulatory Visit: Admission: EM | Admit: 2022-01-10 | Discharge: 2022-01-10 | Discharge status: Against Medical Advice | Payer: 59

## 2022-01-10 ENCOUNTER — Other Ambulatory Visit: Payer: Self-pay

## 2022-12-21 NOTE — L&D Delivery Note (Signed)
OB/[facility]N Faculty Practice Delivery Note  Pamela Zimmerman is a 38 y.o. [id number] s/p SVD at Unknown. She was admitted for SOL.   ROM: rupture date, rupture time, delivery date, or delivery time have not been documented with clear fluid GBS Status: Unknown    Maximum Maternal Temperature: Temp (24hrs), Avg:97.6 F (36.4 C), Min:97.6 F (36.4 C), Max:97.6 F (36.4 C)    Labor Progress: Patient arrived at 7 cm dilation and was induced with AROM.   Delivery Date/Time: 06/27/2023 at 0046 Delivery: Called to room and patient was complete and pushing. Head delivered in ROA position. No nuchal cord present. Shoulder and body delivered in usual fashion. Infant with spontaneous cry, placed on mother's abdomen, dried and stimulated. Cord clamped x 2 after 1-minute delay, and cut by paternal aunt. Cord blood drawn. Placenta delivered spontaneously with gentle cord traction. Fundus firm with massage and Pitocin. Labia, perineum, vagina, and cervix inspected with no lacerations.   Placenta:  spontaneous, intact, 3 vessel cord  Complications: None Lacerations: None EBL: 36 mL Analgesia: Epidural    Infant: APGAR (1 MIN): 9   APGAR (5 MINS): 9   APGAR (10 MINS):    Weight: 2600 g  Derrel Nip, MD  OB Fellow  06/27/2023 1:27 AM

## 2023-01-20 ENCOUNTER — Ambulatory Visit
Admission: EM | Admit: 2023-01-20 | Discharge: 2023-01-20 | Disposition: A | Discharge status: Home / Self Care | Payer: Medicaid Other | Attending: Internal Medicine | Admitting: Internal Medicine

## 2023-01-20 DIAGNOSIS — Z3201 Encounter for pregnancy test, result positive: Secondary | ICD-10-CM

## 2023-01-20 LAB — POCT URINE PREGNANCY: Preg Test, Ur: POSITIVE — AB

## 2023-01-20 NOTE — Discharge Instructions (Signed)
Your pregnancy test was positive.  You need to follow-up with OB/GYN as soon as possible to schedule appointment for further evaluation and management.  Please stop smoking and no illicit drug use.  Start taking a prenatal vitamin that you can get over-the-counter.

## 2023-01-20 NOTE — ED Triage Notes (Signed)
Pt states tested preg(+) at home. Wants repeat testing today for social services documentation. Also states she wants heartbeat checked today has not seen ob.

## 2023-01-20 NOTE — ED Provider Notes (Signed)
[name]    CSN: [id number] Arrival date & time: 01/20/23  1347      History   Chief Complaint Chief Complaint  Patient presents with   pregnancy concern    HPI Pamela Zimmerman is a 37 y.o. female.   Patient presents for pregnancy testing.  She states that she had a positive pregnancy test at home approximately 3 weeks ago.  Last menstrual cycle was in October but patient is not sure of exact date.  Does not take any form of birth control.  Denies abdominal pain, abnormal vaginal bleeding, back pain, fever, urinary frequency.  Patient does endorse some breast tenderness.  Patient reports that she has been pregnant 4 times in the past with 3 living children and 1 ectopic pregnancy.  She does not take any daily medications but does smoke cigarettes.  She also reports that she has a history of illicit drug use with drug of choice being heroin.  She states that she last used approximately 3 weeks ago and is currently being enrolled in a rehabilitation program.  She states that she needs proof of positive pregnancy test to enroll into the program.  She also reported to nursing staff that she wanted her heartbeat checked but she reports to me that what she meant by this is that she wanted to listen to the baby's heartbeat.     Past Medical History:  Diagnosis Date   Depression    had post partum depressin with last delivery   Drug abuse ([place])    Hepatitis C    Heroin abuse ([place])     Patient Active Problem List   Diagnosis Date Noted   Threatened miscarriage in early pregnancy 02/19/2019   Substance abuse affecting pregnancy in first trimester, antepartum ([place]) 02/19/2019   Cellulitis 01/08/2019   IVDA (intravenous drug abuse) complicating pregnancy ([place])    Puncture wound of right hand with infection 01/07/2019   Heroin abuse ([place]) 05/04/2018   Current smoker 01/05/2017   Pregnancy 03/17/2013   Depression 03/02/2013    Past Surgical History:  Procedure  Laterality Date   CHOLECYSTECTOMY     HAND SURGERY Left    I & D EXTREMITY Right 01/08/2019   Procedure: IRRIGATION AND DEBRIDEMENT, RIGHT HAND;  Surgeon: Milly Jakob, MD;  Location: [facility];  Service: Orthopedics;  Laterality: Right;   LEG SURGERY Left    Growth was removed.   Right hand      OB History     Gravida  4   Para  2   Term  1   Preterm  1   AB      Living  2      SAB      IAB      Ectopic      Multiple      Live Births  2            Home Medications    Prior to Admission medications   Medication Sig Start Date End Date Taking? Authorizing Provider  Ascorbic Acid (VITAMIN C) 100 MG tablet Take 100 mg by mouth daily.    [provider]  ibuprofen (ADVIL) 600 MG tablet Take 1 tablet (600 mg total) by mouth every 6 (six) hours as needed for up to 30 doses for mild pain or moderate pain. 09/22/20   Wyvonnia Dusky, MD  Multiple Vitamin (MULTIVITAMIN) tablet Take 1 tablet by mouth daily.    [provider]  Family History Family History  Problem Relation Age of Onset   Cancer Mother        lung   Diabetes Father    Stroke Father    Deafness Brother    Cystic fibrosis Other     Social History Social History   Tobacco Use   Smoking status: Every Day    Packs/day: 0.50    Types: Cigarettes   Smokeless tobacco: Never  Vaping Use   Vaping Use: Never used  Substance Use Topics   Alcohol use: No   Drug use: Yes    Types: Benzodiazepines, IV    Comment: heroin, Fentanyl     Allergies   Patient has no known allergies.   Review of Systems Review of Systems Per HPI  Physical Exam Triage Vital Signs ED Triage Vitals [01/20/23 1424]  Enc Vitals Group     BP 92/61     Pulse Rate 87     Resp 16     Temp 98.6 F (37 C)     Temp Source Oral     SpO2 98 %     Weight      Height      Head Circumference      Peak Flow      Pain Score 0     Pain Loc      Pain Edu?      Excl. in [place]?    No data  found.  Updated Vital Signs BP 95/66   Pulse 87   Temp 98.6 F (37 C) (Oral)   Resp 16   SpO2 98%   Visual Acuity Right Eye Distance:   Left Eye Distance:   Bilateral Distance:    Right Eye Near:   Left Eye Near:    Bilateral Near:     Physical Exam Constitutional:      General: She is not in acute distress.    Appearance: Normal appearance. She is not toxic-appearing or diaphoretic.  HENT:     Head: Normocephalic and atraumatic.  Eyes:     Extraocular Movements: Extraocular movements intact.     Conjunctiva/sclera: Conjunctivae normal.  Cardiovascular:     Rate and Rhythm: Normal rate and regular rhythm.     Pulses: Normal pulses.     Heart sounds: Normal heart sounds.  Pulmonary:     Effort: Pulmonary effort is normal. No respiratory distress.     Breath sounds: Normal breath sounds.  Abdominal:     General: Bowel sounds are normal. There is no distension.     Palpations: Abdomen is soft.     Tenderness: There is no abdominal tenderness.  Neurological:     General: No focal deficit present.     Mental Status: She is alert and oriented to person, place, and time. Mental status is at baseline.  Psychiatric:        Mood and Affect: Mood normal.        Behavior: Behavior normal.        Thought Content: Thought content normal.        Judgment: Judgment normal.      UC Treatments / Results  Labs (all labs ordered are listed, but only abnormal results are displayed) Labs Reviewed  POCT URINE PREGNANCY - Abnormal; Notable for the following components:      Result Value   Preg Test, Ur Positive (*)    All other components within normal limits    EKG   Radiology No results found.  Procedures Procedures (including critical care time)  Medications Ordered in UC Medications - No data to display  Initial Impression / Assessment and Plan / UC Course  I have reviewed the triage vital signs and the nursing notes.  Pertinent labs & imaging results that were  available during my care of the patient were reviewed by me and considered in my medical decision making (see chart for details).     Urine preg test positive today.  Patient does not have any complications related to pregnancy so do not think that emergent evaluation is necessary.  Discussed with patient that I do not have the capabilities to listen to baby's heartbeat here in urgent care and that she will need to follow-up with OB/GYN for this and for further evaluation and management.  Provided patient with contact information for OB/GYN for close follow-up.  Advised her to follow-up with them as soon as possible.  Also advised patient to stop smoking and to avoid illicit drug use.  Patient reports that she is about to be enrolled in rehabilitation program as well.  Advised her to start taking a prenatal vitamin.  She denies that she takes any daily medications so this is reassuring.  Patient's blood pressure is low normal.  She states this is baseline for her.  Patient verbalized understanding and was agreeable  with plan. Final Clinical Impressions(s) / UC Diagnoses   Final diagnoses:  Positive urine pregnancy test     Discharge Instructions      Your pregnancy test was positive.  You need to follow-up with OB/GYN as soon as possible to schedule appointment for further evaluation and management.  Please stop smoking and no illicit drug use.  Start taking a prenatal vitamin that you can get over-the-counter.     ED Prescriptions   None    PDMP not reviewed this encounter.   Teodora Medici, [place] 01/20/23 [phone]

## 2023-02-01 ENCOUNTER — Ambulatory Visit: Payer: 59 | Admitting: Nurse Practitioner

## 2023-06-26 ENCOUNTER — Encounter (HOSPITAL_COMMUNITY): Payer: Self-pay

## 2023-06-26 ENCOUNTER — Inpatient Hospital Stay (HOSPITAL_COMMUNITY)
Admission: AD | Admit: 2023-06-26 | Discharge: 2023-06-28 | DRG: 806 | Disposition: A | Discharge status: Home / Self Care | Payer: 59 | Attending: Obstetrics and Gynecology | Admitting: Obstetrics and Gynecology

## 2023-06-26 ENCOUNTER — Inpatient Hospital Stay (HOSPITAL_COMMUNITY): Payer: 59 | Admitting: Anesthesiology

## 2023-06-26 DIAGNOSIS — Z30017 Encounter for initial prescription of implantable subdermal contraceptive: Secondary | ICD-10-CM

## 2023-06-26 DIAGNOSIS — F1110 Opioid abuse, uncomplicated: Secondary | ICD-10-CM | POA: Diagnosis present

## 2023-06-26 DIAGNOSIS — Z3A00 Weeks of gestation of pregnancy not specified: Secondary | ICD-10-CM

## 2023-06-26 DIAGNOSIS — F17210 Nicotine dependence, cigarettes, uncomplicated: Secondary | ICD-10-CM | POA: Diagnosis present

## 2023-06-26 DIAGNOSIS — O99324 Drug use complicating childbirth: Secondary | ICD-10-CM | POA: Diagnosis not present

## 2023-06-26 DIAGNOSIS — F1910 Other psychoactive substance abuse, uncomplicated: Secondary | ICD-10-CM | POA: Diagnosis present

## 2023-06-26 DIAGNOSIS — Z975 Presence of (intrauterine) contraceptive device: Secondary | ICD-10-CM

## 2023-06-26 DIAGNOSIS — M79641 Pain in right hand: Secondary | ICD-10-CM | POA: Diagnosis present

## 2023-06-26 DIAGNOSIS — O99320 Drug use complicating pregnancy, unspecified trimester: Secondary | ICD-10-CM | POA: Diagnosis present

## 2023-06-26 DIAGNOSIS — Z3490 Encounter for supervision of normal pregnancy, unspecified, unspecified trimester: Secondary | ICD-10-CM

## 2023-06-26 DIAGNOSIS — O9972 Diseases of the skin and subcutaneous tissue complicating childbirth: Secondary | ICD-10-CM | POA: Diagnosis present

## 2023-06-26 DIAGNOSIS — O99334 Smoking (tobacco) complicating childbirth: Secondary | ICD-10-CM | POA: Diagnosis present

## 2023-06-26 DIAGNOSIS — O0933 Supervision of pregnancy with insufficient antenatal care, third trimester: Secondary | ICD-10-CM | POA: Diagnosis not present

## 2023-06-26 DIAGNOSIS — O26893 Other specified pregnancy related conditions, third trimester: Secondary | ICD-10-CM | POA: Diagnosis present

## 2023-06-26 DIAGNOSIS — L0390 Cellulitis, unspecified: Secondary | ICD-10-CM | POA: Diagnosis present

## 2023-06-26 LAB — CBC
HCT: 36.8 % (ref 36.0–46.0)
Hemoglobin: 12.1 g/dL (ref 12.0–15.0)
MCH: 26.9 pg (ref 26.0–34.0)
MCHC: 32.9 g/dL (ref 30.0–36.0)
MCV: 82 fL (ref 80.0–100.0)
Platelets: 243 10*3/uL (ref 150–400)
RBC: 4.49 MIL/uL (ref 3.87–5.11)
RDW: 13.2 % (ref 11.5–15.5)
WBC: 12.6 10*3/uL — ABNORMAL HIGH (ref 4.0–10.5)
nRBC: 0 % (ref 0.0–0.2)

## 2023-06-26 MED ORDER — OXYCODONE-ACETAMINOPHEN 5-325 MG PO TABS: 2.0000 | ORAL_TABLET | ORAL | Status: DC | PRN

## 2023-06-26 MED ORDER — LACTATED RINGERS IV SOLN: 500.0000 mL | INTRAVENOUS | Status: DC | PRN

## 2023-06-26 MED ORDER — ACETAMINOPHEN 325 MG PO TABS
650.0000 mg | ORAL_TABLET | ORAL | Status: DC | PRN
  Administered 2023-06-27: 650 mg via ORAL
  Filled 2023-06-26: qty 2

## 2023-06-26 MED ORDER — DIPHENHYDRAMINE HCL 50 MG/ML IJ SOLN: 12.5000 mg | INTRAMUSCULAR | Status: DC | PRN

## 2023-06-26 MED ORDER — EPHEDRINE 5 MG/ML INJ: 10.0000 mg | INTRAVENOUS | Status: DC | PRN

## 2023-06-26 MED ORDER — SOD CITRATE-CITRIC ACID 500-334 MG/5ML PO SOLN: 30.0000 mL | ORAL | Status: DC | PRN

## 2023-06-26 MED ORDER — FENTANYL-BUPIVACAINE-NACL 0.5-0.125-0.9 MG/250ML-% EP SOLN
12.0000 mL/h | EPIDURAL | Status: DC | PRN
  Administered 2023-06-27: 12 mL/h via EPIDURAL

## 2023-06-26 MED ORDER — PHENYLEPHRINE 80 MCG/ML (10ML) SYRINGE FOR IV PUSH (FOR BLOOD PRESSURE SUPPORT): 80.0000 ug | PREFILLED_SYRINGE | INTRAVENOUS | Status: DC | PRN

## 2023-06-26 MED ORDER — OXYTOCIN-SODIUM CHLORIDE 30-0.9 UT/500ML-% IV SOLN
2.5000 [IU]/h | INTRAVENOUS | Status: DC
  Filled 2023-06-26: qty 500

## 2023-06-26 MED ORDER — LACTATED RINGERS IV SOLN: 500.0000 mL | Freq: Once | INTRAVENOUS | Status: DC

## 2023-06-26 MED ORDER — OXYTOCIN BOLUS FROM INFUSION
333.0000 mL | Freq: Once | INTRAVENOUS | Status: AC
  Administered 2023-06-27: 333 mL via INTRAVENOUS

## 2023-06-26 MED ORDER — SODIUM CHLORIDE 0.9 % IV SOLN: 1.0000 g | INTRAVENOUS | Status: DC

## 2023-06-26 MED ORDER — ONDANSETRON HCL 4 MG/2ML IJ SOLN: 4.0000 mg | Freq: Four times a day (QID) | INTRAMUSCULAR | Status: DC | PRN

## 2023-06-26 MED ORDER — OXYCODONE-ACETAMINOPHEN 5-325 MG PO TABS: 1.0000 | ORAL_TABLET | ORAL | Status: DC | PRN

## 2023-06-26 MED ORDER — FENTANYL CITRATE (PF) 100 MCG/2ML IJ SOLN
INTRAMUSCULAR | Status: AC
  Filled 2023-06-26: qty 2

## 2023-06-26 MED ORDER — LIDOCAINE HCL (PF) 1 % IJ SOLN: 30.0000 mL | INTRAMUSCULAR | Status: DC | PRN

## 2023-06-26 MED ORDER — LACTATED RINGERS IV SOLN: INTRAVENOUS | Status: DC

## 2023-06-26 MED ORDER — FENTANYL-BUPIVACAINE-NACL 0.5-0.125-0.9 MG/250ML-% EP SOLN
EPIDURAL | Status: AC
  Filled 2023-06-26: qty 250

## 2023-06-26 MED ORDER — FLEET ENEMA 7-19 GM/118ML RE ENEM: 1.0000 | ENEMA | RECTAL | Status: DC | PRN

## 2023-06-26 MED ORDER — SODIUM CHLORIDE 0.9 % IV SOLN
2.0000 g | Freq: Once | INTRAVENOUS | Status: DC
  Administered 2023-06-26: 2 g via INTRAVENOUS
  Filled 2023-06-26: qty 2000

## 2023-06-26 NOTE — MAU Note (Addendum)
.  Pamela Zimmerman is a 37 y.o. at Unknown here in MAU reporting: contractions that began 2000. Pt reports receiving any prenatal care Pt states she just got approved for Methadone treatment. Swollen reddened area noted on R pinky finger area-pt states-previous injection site and may "have an infection in the bone" Pt denies medication allergies-states latex causes skin irritation Pt is currently homeless Reports using IV heroin daily( half a gram/day) LMP-September-October  Onset of complaint: 2000 Pain score: 10 There were no vitals filed for this visit.   FHT:132bpm Lab orders placed from triage:  mau labor                                            IV team consult  6/100/-1/BBOW- vertex

## 2023-06-26 NOTE — Anesthesia Preprocedure Evaluation (Signed)
Anesthesia Evaluation  Patient identified by MRN, date of birth, ID band Patient awake    Reviewed: Allergy & Precautions, NPO status , Patient's Chart, lab work & pertinent test results  Airway Mallampati: I  TM Distance: >3 FB Neck ROM: Full    Dental no notable dental hx. (+) Teeth Intact, Dental Advisory Given   Pulmonary Current Smoker   Pulmonary exam normal breath sounds clear to auscultation       Cardiovascular negative cardio ROS Normal cardiovascular exam Rhythm:Regular Rate:Normal     Neuro/Psych  PSYCHIATRIC DISORDERS  Depression       GI/Hepatic negative GI ROS,,,(+)     substance abuse  IV drug useOn a half gram of heroin    Endo/Other    Renal/GU negative Renal ROS     Musculoskeletal  (+)  narcotic dependent  Abdominal   Peds  Hematology Lab Results      Component                Value               Date                      WBC                      12.6 (H)            06/26/2023                HGB                      12.1                06/26/2023                HCT                      36.8                06/26/2023                MCV                      82.0                06/26/2023                PLT                      243                 06/26/2023              Anesthesia Other Findings All: LAtex  Reproductive/Obstetrics (+) Pregnancy                              Anesthesia Physical Anesthesia Plan  ASA: 2  Anesthesia Plan: Epidural   Post-op Pain Management:    Induction:   PONV Risk Score and Plan:   Airway Management Planned:   Additional Equipment:   Intra-op Plan:   Post-operative Plan:   Informed Consent: I have reviewed the patients History and Physical, chart, labs and discussed the procedure including the risks, benefits and alternatives for the proposed anesthesia with the patient or authorized representative who has indicated  his/her understanding and  acceptance.       Plan Discussed with:   Anesthesia Plan Comments: (38 wk  G4P2 w no prenatal care and a hx of daily heroin use for LEA)         Anesthesia Quick Evaluation

## 2023-06-26 NOTE — Anesthesia Procedure Notes (Incomplete)
Epidural Patient location during procedure: OB Start time: 06/26/2023 11:47 PM  Staffing Anesthesiologist: Trevor Iha, MD Performed: anesthesiologist   Preanesthetic Checklist Completed: patient identified, IV checked, site marked, risks and benefits discussed, surgical consent, monitors and equipment checked, pre-op evaluation and timeout performed  Epidural Patient position: sitting Prep: DuraPrep and site prepped and draped Patient monitoring: continuous pulse ox and blood pressure Approach: midline Injection technique: LOR air  Needle:  Needle type: Tuohy  Needle gauge: 17 G Needle length: 9 cm and 9 Needle insertion depth: 5 cm cm Catheter type: closed end flexible Catheter size: 19 Gauge Catheter at skin depth: 10 cm Test dose: negative  Assessment Events: blood not aspirated, no cerebrospinal fluid, injection not painful, no injection resistance, no paresthesia and negative IV test  Additional Notes Patient identified. Risks/Benefits/Options discussed with patient including but not limited to bleeding, infection, nerve damage, paralysis, failed block, incomplete pain control, headache, blood pressure changes, nausea, vomiting, reactions to medication both or allergic, itching and postpartum back pain. Confirmed with bedside nurse the patient's most recent platelet count. Confirmed with patient that they are not currently taking any anticoagulation, have any bleeding history or any family history of bleeding disorders. Patient expressed understanding and wished to proceed. All questions were answered. Sterile technique was used throughout the entire procedure. Please see nursing notes for vital signs. Test dose was given through epidural needle and negative prior to continuing to dose epidural or start infusion. Warning signs of high block given to the patient including shortness of breath, tingling/numbness in hands, complete motor block, or any concerning symptoms with  instructions to call for help. Patient was given instructions on fall risk and not to get out of bed. All questions and concerns addressed with instructions to call with any issues.  Attempt (S) . Patient tolerated procedure well.

## 2023-06-26 NOTE — H&P (Cosign Needed)
OBSTETRIC ADMISSION HISTORY AND PHYSICAL  Pamela Zimmerman is a 37 y.o. female [id number] with IUP at Unknown [place] presenting with contractions onset 2000. LMP September-October, pt thinks that she is 38w. Denies prenatal care. Reports daily heroin use and pt is currently homeless. She reports +FMs, No LOF, no VB, no blurry vision, headaches or peripheral edema, and RUQ pain.   Notes swollen, red R pinky finger at location of previous injection site.   Dating: none  Sono:  none   Prenatal History/Complications: no PNC, heroin use  Past Medical History: Past Medical History:  Diagnosis Date   Depression    had post partum depressin with last delivery   Drug abuse ([facility])    Hepatitis C    Heroin abuse ([facility])     Past Surgical History: Past Surgical History:  Procedure Laterality Date   CHOLECYSTECTOMY     HAND SURGERY Left    I & D EXTREMITY Right 01/08/2019   Procedure: IRRIGATION AND DEBRIDEMENT, RIGHT HAND;  Surgeon: Mack Hook, MD;  Location: [facility] OR;  Service: Orthopedics;  Laterality: Right;   LEG SURGERY Left    Growth was removed.   Right hand      Obstetrical History: OB History     Gravida  4   Para  2   Term  1   Preterm  1   AB      Living  2      SAB      IAB      Ectopic      Multiple      Live Births  2           Social History Social History   Socioeconomic History   Marital status: Single    Spouse name: Not on file   Number of children: Not on file   Years of education: Not on file   Highest education level: Not on file  Occupational History   Not on file  Tobacco Use   Smoking status: Every Day    Packs/day: .5    Types: Cigarettes   Smokeless tobacco: Never  Vaping Use   Vaping Use: Never used  Substance and Sexual Activity   Alcohol use: No   Drug use: Yes    Types: Benzodiazepines, IV    Comment: heroin, Fentanyl   Sexual activity: Yes    Birth control/protection: None  Other Topics Concern   Not on file   Social History Narrative   Not on file   Social Determinants of Health   Financial Resource Strain: Not on file  Food Insecurity: Not on file  Transportation Needs: Not on file  Physical Activity: Not on file  Stress: Not on file  Social Connections: Unknown (01/08/2019)   Social Connection and Isolation Panel [NHANES]    Frequency of Communication with Friends and Family: Patient declined    Frequency of Social Gatherings with Friends and Family: Patient declined    Attends Religious Services: Patient declined    Database administrator or Organizations: Patient declined    Attends Banker Meetings: Patient declined    Marital Status: Patient declined    Family History: Family History  Problem Relation Age of Onset   Cancer Mother        lung   Diabetes Father    Stroke Father    Deafness Brother    Cystic fibrosis Other     Allergies: No Known Allergies  Medications Prior to Admission  Medication Sig Dispense Refill Last Dose   Ascorbic Acid (VITAMIN C) 100 MG tablet Take 100 mg by mouth daily.      ibuprofen (ADVIL) 600 MG tablet Take 1 tablet (600 mg total) by mouth every 6 (six) hours as needed for up to 30 doses for mild pain or moderate pain. 30 tablet 0    Multiple Vitamin (MULTIVITAMIN) tablet Take 1 tablet by mouth daily.        Review of Systems   All systems reviewed and negative except as stated in HPI  Height 5\' 6"  (1.676 m), weight 63.5 kg. General appearance: alert and cooperative Lungs: clear to auscultation bilaterally Heart: regular rate and rhythm Abdomen: soft, non-tender; bowel sounds normal Extremities: no sign of DVT Presentation: cephalic Fetal monitoringBaseline: 120 bpm and Variability: Good {> 6 bpm) Uterine activityDate/time of onset: 2200, Frequency: Every 2 minutes Dilation: 6 Effacement (%): 100 Station: -1 Exam by:: Sharen Counter, CNM   Prenatal labs: ABO, Rh:  n/a Antibody:  n/a Rubella:  n/a RPR:    n/a HBsAg:   n/a HIV:   n/a GBS:   n/a 1 hr Glucola none Genetic screening  none Anatomy US none  Prenatal Transfer Tool    No results found for this or any previous visit (from the past 24 hour(s)).  Patient Active Problem List   Diagnosis Date Noted   Indication for care in labor or delivery 06/26/2023   Threatened miscarriage in early pregnancy 02/19/2019   Substance abuse affecting pregnancy in first trimester, antepartum ([facility]) 02/19/2019   Cellulitis 01/08/2019   IVDA (intravenous drug abuse) complicating pregnancy ([facility])    Puncture wound of right hand with infection 01/07/2019   Heroin abuse ([facility]) 05/04/2018   Current smoker 01/05/2017   Pregnancy 03/17/2013   Depression 03/02/2013    Assessment/Plan:  Pamela Zimmerman is a 37 y.o. [id number] at Unknown here for spontaneous labor  #Labor: progressing #Pain: Per pt request #FWB: Cat 1 #ID:  Unknown, given term will order PCR but treat at this time. Ampicillin ordered prior to knowing gestational age.  #MOF: will be bottle #MOC: pt wants tubal, only has 1 tube d/t previous ectopic. Will need to be interval. Will further discuss after delivery.  #Circ:  N/a  Para March, DO  06/26/2023, 11:13 PM   Fellow Attestation  I saw and evaluated the patient, performing the key elements of the service.I  personally performed or re-performed the history, physical exam, and medical decision making activities of this service and have verified that the service and findings are accurately documented in the resident's note. I developed the management plan that is described in the resident's note, and I agree with the content, with my edits above.    Derrel Nip, MD OB Fellow

## 2023-06-26 NOTE — Anesthesia Procedure Notes (Signed)
Epidural Patient location during procedure: OB Start time: 06/26/2023 11:47 PM End time: 06/27/2023 12:02 AM  Staffing Anesthesiologist: Trevor Iha, MD Performed: anesthesiologist   Preanesthetic Checklist Completed: patient identified, IV checked, site marked, risks and benefits discussed, surgical consent, monitors and equipment checked, pre-op evaluation and timeout performed  Epidural Patient position: sitting Prep: DuraPrep and site prepped and draped Patient monitoring: continuous pulse ox and blood pressure Approach: midline Location: L3-L4 Injection technique: LOR air  Needle:  Needle type: Tuohy  Needle gauge: 17 G Needle length: 9 cm and 9 Needle insertion depth: 5 cm cm Catheter type: closed end flexible Catheter size: 19 Gauge Catheter at skin depth: 10 cm Test dose: negative  Assessment Events: blood not aspirated, no cerebrospinal fluid, injection not painful, no injection resistance, no paresthesia and negative IV test  Additional Notes Patient identified. Risks/Benefits/Options discussed with patient including but not limited to bleeding, infection, nerve damage, paralysis, failed block, incomplete pain control, headache, blood pressure changes, nausea, vomiting, reactions to medication both or allergic, itching and postpartum back pain. Confirmed with bedside nurse the patient's most recent platelet count. Confirmed with patient that they are not currently taking any anticoagulation, have any bleeding history or any family history of bleeding disorders. Patient expressed understanding and wished to proceed. All questions were answered. Sterile technique was used throughout the entire procedure. Please see nursing notes for vital signs. Test dose was given through epidural needle and negative prior to continuing to dose epidural or start infusion. Warning signs of high block given to the patient including shortness of breath, tingling/numbness in hands, complete  motor block, or any concerning symptoms with instructions to call for help. Patient was given instructions on fall risk and not to get out of bed. All questions and concerns addressed with instructions to call with any issues. 1 Attempt (S) . Patient tolerated procedure well.

## 2023-06-27 ENCOUNTER — Encounter (HOSPITAL_COMMUNITY): Payer: Self-pay | Admitting: Obstetrics and Gynecology

## 2023-06-27 ENCOUNTER — Inpatient Hospital Stay (HOSPITAL_COMMUNITY): Payer: 59

## 2023-06-27 DIAGNOSIS — O99324 Drug use complicating childbirth: Secondary | ICD-10-CM

## 2023-06-27 DIAGNOSIS — Z3A00 Weeks of gestation of pregnancy not specified: Secondary | ICD-10-CM

## 2023-06-27 DIAGNOSIS — O0933 Supervision of pregnancy with insufficient antenatal care, third trimester: Secondary | ICD-10-CM

## 2023-06-27 LAB — RAPID URINE DRUG SCREEN, HOSP PERFORMED
Amphetamines: POSITIVE — AB
Barbiturates: NOT DETECTED
Benzodiazepines: POSITIVE — AB
Cocaine: POSITIVE — AB
Opiates: POSITIVE — AB
Tetrahydrocannabinol: NOT DETECTED

## 2023-06-27 LAB — TYPE AND SCREEN
ABO/RH(D): O POS
Antibody Screen: NEGATIVE

## 2023-06-27 LAB — HEPATITIS B SURFACE ANTIGEN: Hepatitis B Surface Ag: NONREACTIVE

## 2023-06-27 LAB — GROUP B STREP BY PCR: Group B strep by PCR: NEGATIVE

## 2023-06-27 LAB — HIV ANTIBODY (ROUTINE TESTING W REFLEX): HIV Screen 4th Generation wRfx: NONREACTIVE

## 2023-06-27 LAB — RPR: RPR Ser Ql: NONREACTIVE

## 2023-06-27 MED ORDER — FENTANYL CITRATE (PF) 100 MCG/2ML IJ SOLN
INTRAMUSCULAR | Status: DC | PRN
  Administered 2023-06-26: 10 ug via INTRATHECAL

## 2023-06-27 MED ORDER — BUPIVACAINE HCL (PF) 0.25 % IJ SOLN
INTRAMUSCULAR | Status: DC | PRN
  Administered 2023-06-26: 2 mg via INTRATHECAL

## 2023-06-27 MED ORDER — IBUPROFEN 600 MG PO TABS
600.0000 mg | ORAL_TABLET | Freq: Four times a day (QID) | ORAL | Status: DC
  Administered 2023-06-27 – 2023-06-28 (×5): 600 mg via ORAL
  Filled 2023-06-27 (×5): qty 1

## 2023-06-27 MED ORDER — ONDANSETRON HCL 4 MG PO TABS: 4.0000 mg | ORAL_TABLET | ORAL | Status: DC | PRN

## 2023-06-27 MED ORDER — ONDANSETRON HCL 4 MG/2ML IJ SOLN: 4.0000 mg | INTRAMUSCULAR | Status: DC | PRN

## 2023-06-27 MED ORDER — ACETAMINOPHEN 325 MG PO TABS
650.0000 mg | ORAL_TABLET | ORAL | Status: DC | PRN
  Administered 2023-06-27 – 2023-06-28 (×3): 650 mg via ORAL
  Filled 2023-06-27 (×3): qty 2

## 2023-06-27 MED ORDER — SENNOSIDES-DOCUSATE SODIUM 8.6-50 MG PO TABS
2.0000 | ORAL_TABLET | ORAL | Status: DC
  Filled 2023-06-27: qty 2

## 2023-06-27 MED ORDER — WITCH HAZEL-GLYCERIN EX PADS: 1.0000 | MEDICATED_PAD | CUTANEOUS | Status: DC | PRN

## 2023-06-27 MED ORDER — FAMOTIDINE IN NACL 20-0.9 MG/50ML-% IV SOLN
20.0000 mg | Freq: Once | INTRAVENOUS | Status: DC
  Filled 2023-06-27: qty 50

## 2023-06-27 MED ORDER — DIBUCAINE (PERIANAL) 1 % EX OINT: 1.0000 | TOPICAL_OINTMENT | CUTANEOUS | Status: DC | PRN

## 2023-06-27 MED ORDER — TETANUS-DIPHTH-ACELL PERTUSSIS 5-2.5-18.5 LF-MCG/0.5 IM SUSY: 0.5000 mL | PREFILLED_SYRINGE | Freq: Once | INTRAMUSCULAR | Status: DC

## 2023-06-27 MED ORDER — KETOROLAC TROMETHAMINE 30 MG/ML IJ SOLN
30.0000 mg | Freq: Once | INTRAMUSCULAR | Status: AC
  Administered 2023-06-27: 30 mg via INTRAVENOUS
  Filled 2023-06-27: qty 1

## 2023-06-27 MED ORDER — COCONUT OIL OIL: 1.0000 | TOPICAL_OIL | Status: DC | PRN

## 2023-06-27 MED ORDER — SIMETHICONE 80 MG PO CHEW: 80.0000 mg | CHEWABLE_TABLET | ORAL | Status: DC | PRN

## 2023-06-27 MED ORDER — PRENATAL MULTIVITAMIN CH
1.0000 | ORAL_TABLET | Freq: Every day | ORAL | Status: DC
  Filled 2023-06-27: qty 1

## 2023-06-27 MED ORDER — METHADONE HCL 10 MG PO TABS
20.0000 mg | ORAL_TABLET | Freq: Every day | ORAL | Status: DC
  Administered 2023-06-27 – 2023-06-28 (×2): 20 mg via ORAL
  Filled 2023-06-27 (×2): qty 2

## 2023-06-27 MED ORDER — BENZOCAINE-MENTHOL 20-0.5 % EX AERO
1.0000 | INHALATION_SPRAY | CUTANEOUS | Status: DC | PRN
  Filled 2023-06-27: qty 56

## 2023-06-27 MED ORDER — ZOLPIDEM TARTRATE 5 MG PO TABS: 5.0000 mg | ORAL_TABLET | Freq: Every evening | ORAL | Status: DC | PRN

## 2023-06-27 MED ORDER — DIPHENHYDRAMINE HCL 25 MG PO CAPS: 25.0000 mg | ORAL_CAPSULE | Freq: Four times a day (QID) | ORAL | Status: DC | PRN

## 2023-06-27 NOTE — Progress Notes (Signed)
Patient now more alert apparently called out for infant to come back from nursery earlier however MD wanted infant to be fed in nursery. Mom encouraged to change her pad more frequently and void. I asked her if she felt ok and she said yes that she was just really tired earlier. Urine specimen sent off for [facility] and UDS. Mom asked if infant could go over to see FOB inpatient at [facility] it and I told her we could not let infant go over there to see FOB. Mom insisted on getting her clothes on to go see FOB . I asked if she felt ok and not dizzy and she stated she feels fine and got her clothes on to go over to see him. I told Pamela Zimmerman about her wanting to go over there. I told mom she needs to be back on unit within the hour or so and not be gone or leave hospital. She understood. She wants infant back in room when she returns. I  have talked to SSW numerous times regarding infant and mom. SSW recommends infant staying in Nursery until UDS results.

## 2023-06-27 NOTE — Progress Notes (Addendum)
CSW to meet with MOB tomorrow, 06/28/2023 to complete clinical assessment.   MOB's urine drug screen resulted positive for amphetamines, opiates, benzodiazepines, and cocaine. Infant urine drug screen resulted positive for opiates. Per chart review, MOB reports daily heroin IV use (1/2 gram daily). Per chart review, MOB reports she is currently homeless and has 3 children who are not in her custody (DOB: 2012, 2014, and Autumn Locklear DOB: 06/17/2017. No prenatal care noted during pregnancy.  Due to substance use and likelihood of prior CPS involvement, CSW placed preliminary CPS report to [facility] prior to meeting with MOB and made CPS report to on call social worker, Thomasenia Bottoms.   Per Thomasenia Bottoms, MOB's daughter, Gertie Baron (DOB: 06/18/2023) is in foster care in [place]. At this time, it is unknown if case will be assigned to [place] or [facility] CPS.   CPS worker Thomasenia Bottoms advised hospital staff to follow hospital protocol and remove infant from MOB's room if safety concerns are observed regarding MOB's care towards infant. CPS to likely meet with MOB tomorrow, 06/28/23. Barriers to infant's discharge. Infant's care team provided update.   Signed,  Norberto Sorenson, MSW, LCSWA, LCASA 06/27/2023 3:22 PM

## 2023-06-27 NOTE — Anesthesia Postprocedure Evaluation (Signed)
Anesthesia Post Note  Patient: Pamela Zimmerman  Procedure(s) Performed: AN AD HOC LABOR EPIDURAL     Patient location during evaluation: Mother Baby Anesthesia Type: Epidural Level of consciousness: awake and alert Pain management: pain level controlled Vital Signs Assessment: post-procedure vital signs reviewed and stable Respiratory status: spontaneous breathing, nonlabored ventilation and respiratory function stable Cardiovascular status: stable Postop Assessment: no headache, no backache, epidural receding, no apparent nausea or vomiting, patient able to bend at knees, able to ambulate and adequate PO intake Anesthetic complications: no   No notable events documented.  Last Vitals:  Vitals:   06/27/23 0400 06/27/23 0800  BP: 117/62 120/75  Pulse: 64 68  Resp: 18 18  Temp: 36.6 C 36.7 C  SpO2: 100% 99%    Last Pain:  Vitals:   06/27/23 0400  TempSrc: Oral  PainSc:    Pain Goal:                   [place]

## 2023-06-27 NOTE — Progress Notes (Signed)
GC\Chlamydia test wont be ran until this evening until cytology get in to run it per LAB>

## 2023-06-27 NOTE — Progress Notes (Signed)
Mom is arousable only when touching or talking loud. I told mom I will take infant to nursery since she is extremely tired. I could barley get her to raise her arm . For infant safety I took infant to room and SSW called and left message regarding mom's history. Dr. Bernarda Caffey aware. Mom knows to call when she is awake enough to take care of infant, however she is aware I took infant to nursery. Mom's MD called to get appropriate labs ordered on her and to notify them of patients status. We need UDS on mom and GCCL specimen via urine. Mom told to pee in the specimen cup however mom  was soo tired not sure if she understood.

## 2023-06-27 NOTE — Progress Notes (Signed)
Around 1330 patient went to go see [facility] on [address] and got lost and accidentally went into a MRSA contact precaution room just for a minute did not touch anything. Staff directed her to her FOBs inpatient room and was escorted back to 5th floor shortly after visit. Infectious disease called and asked about what precautions we should take since she went into a MRSA room. She stated since she didn't touch anything and was not in there long that we did need to do anything in regards to precautions here that is was ok for her just to shower right away. Patient did shower. Infant now in room with mom consoleable. Infant does not eat well so we told mom we would have to try to feed infant when infant due to eat. Bulb suctioned explained to mom if infant spits up. Dr Bernarda Caffey aware of infant being tachypneic intermittently.

## 2023-06-27 NOTE — Discharge Summary (Signed)
     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)   Cellulitis   Substance abuse affecting pregnancy in first trimester, antepartum (HCC)  Additional problems: ***    Discharge diagnosis: {DX.:23714}                                              Post partum procedures:{Postpartum procedures:23558} Augmentation: {Augmentation:20782} Complications: {OB Labor/Delivery Complications:20784}  Hospital course: {Courses:23701}  Magnesium Sulfate received: No BMZ received: No Rhophylac:N/A MMR:{MMR:30440033} T-DaP:{Tdap:23962} Flu: {WUJ:81191} Transfusion:{Transfusion received:30440034}  Physical exam  Vitals:   06/26/23 2301 06/26/23 2307 06/27/23 0000  BP:   (!) 150/76  Pulse:   (!) 57  Temp:  97.6 F (36.4 C)   TempSrc:  Oral   Weight: 63.5 kg    Height: 5\' 6"  (1.676 m)     General: {Exam; general:21111117} Lochia: {Desc; appropriate/inappropriate:30686::"appropriate"} Uterine Fundus: {Desc; firm/soft:30687} Incision: {Exam; incision:21111123} DVT Evaluation: {Exam; dvt:2111122} Labs: Lab Results  Component Value Date   WBC 12.6 (H) 06/26/2023   HGB 12.1 06/26/2023   HCT 36.8 06/26/2023   MCV 82.0 06/26/2023   PLT 243 06/26/2023      Latest Ref Rng & Units 06/20/2021   12:51 PM  CMP  Glucose 65 - 99 mg/dL 478   BUN 6 - 20 mg/dL 7   Creatinine 2.95 - 6.21 mg/dL 3.08   Sodium 657 - 846 mmol/L 143   Potassium 3.5 - 5.2 mmol/L 4.7   Chloride 96 - 106 mmol/L 102    CO2 20 - 29 mmol/L 21   Calcium 8.7 - 10.2 mg/dL 9.5   Total Protein 6.0 - 8.5 g/dL 6.5   Total Bilirubin 0.0 - 1.2 mg/dL 0.5   Alkaline Phos 44 - 121 IU/L 68   AST 0 - 40 IU/L 61   ALT 0 - 32 IU/L 68    Edinburgh Score:     No data to display           After visit meds:  Allergies as of 06/27/2023   No Known Allergies   Med Rec must be completed prior to using this [facility]***        Discharge home in stable condition Infant Feeding: {Baby feeding:23562} Infant Disposition:{CHL IP OB HOME WITH NGEXBM:84132} Discharge instruction: per After Visit Summary and Postpartum booklet. Activity: Advance as tolerated. Pelvic rest for 6 weeks.  Diet: {OB GMWN:02725366} Future Appointments:No future appointments. Follow up Visit: Message sent   Please schedule this patient for a In person postpartum visit in 4 weeks with the following provider: Any provider. Additional Postpartum F/U: None   High risk pregnancy complicated by:  NPC, OUD  Delivery mode:  Vaginal, Spontaneous  Anticipated Birth Control:   desires interval tubal ligation. Undecided prior to discharge    06/27/2023 Celedonio Savage, MD

## 2023-06-28 DIAGNOSIS — Z30017 Encounter for initial prescription of implantable subdermal contraceptive: Secondary | ICD-10-CM

## 2023-06-28 DIAGNOSIS — Z975 Presence of (intrauterine) contraceptive device: Secondary | ICD-10-CM

## 2023-06-28 LAB — RUBELLA ANTIBODY, IGM: Rubella IgM: <20 AU/mL (ref 0.0–19.9)

## 2023-06-28 MED ORDER — ETONOGESTREL 68 MG ~~LOC~~ IMPL
68.0000 mg | DRUG_IMPLANT | Freq: Once | SUBCUTANEOUS | Status: AC
  Administered 2023-06-28: 68 mg via SUBCUTANEOUS
  Filled 2023-06-28: qty 1

## 2023-06-28 MED ORDER — LIDOCAINE HCL 1 % IJ SOLN
0.0000 mL | Freq: Once | INTRAMUSCULAR | Status: AC | PRN
  Administered 2023-06-28: 3 mL via INTRADERMAL
  Filled 2023-06-28: qty 20

## 2023-06-28 MED ORDER — METHADONE HCL 10 MG PO TABS
20.0000 mg | ORAL_TABLET | Freq: Once | ORAL | Status: AC
  Administered 2023-06-28: 20 mg via ORAL
  Filled 2023-06-28: qty 2

## 2023-06-28 MED ORDER — METHADONE HCL 10 MG/ML PO CONC: 20.0000 mg | Freq: Once | ORAL | Status: DC

## 2023-06-28 NOTE — Progress Notes (Signed)
CSW acknowledges consult and completed clinical assessment.  Clinical documentation will follow.  There are no barriers to MOB's d/c. There are barriers to infant's discharge per [facility] CPS.  Blaine Hamper, MSW, LCSW Clinical Social Work [phone]

## 2023-06-28 NOTE — Progress Notes (Signed)
RN escorted pt to central care nursery to speak with Nursery NP for updates on infant status.

## 2023-06-28 NOTE — Progress Notes (Signed)
Attempted twice to see patient in room, she was not there on either occasion. Asked postpartum staff to contact me when patient returns to her room.

## 2023-06-28 NOTE — Progress Notes (Signed)
Katherina Right, NP from central nursery attempted to visit pt in room for update on infant NICU transfer.  Pt not in room, off unit.

## 2023-06-28 NOTE — Procedures (Signed)
Post-Placental Nexplanon Insertion Procedure Note  Patient was identified. Informed consent was signed, signed copy in chart. A time-out was performed.    The insertion site was identified 8-10 cm (3-4 inches) from the medial epicondyle of the humerus and 3-5 cm (1.25-2 inches) posterior to (below) the sulcus (groove) between the biceps and triceps muscles of the patient's LEFT arm and marked. The site was prepped and draped in the usual sterile fashion. Pt was prepped with alcohol swab and then injected with 2 cc of 1% lidocaine. The site was prepped with betadine. Nexplanon removed form packaging,  Device confirmed in needle, then inserted full length of needle and withdrawn per handbook instructions. Provider and patient verified presence of the implant in the woman's arm by palpation. Pt insertion site was covered with steristrips/adhesive bandage and pressure bandage. There was minimal blood loss. Patient tolerated procedure well.  Patient was given post procedure instructions and Nexplanon user card with expiration date. Condoms were recommended for STI prevention. Patient was asked to keep the pressure dressing on for 24 hours to minimize bruising and keep the adhesive bandage on for 3-5 days. The patient verbalized understanding of the plan of care and agrees.   Lot #   [id number]   Expiration Date04/12/2024

## 2023-06-29 ENCOUNTER — Telehealth (HOSPITAL_COMMUNITY): Payer: Self-pay | Admitting: *Deleted

## 2023-06-29 DIAGNOSIS — Z1331 Encounter for screening for depression: Secondary | ICD-10-CM

## 2023-06-29 LAB — HCV RT-PCR, QUANT (NON-GRAPH)
HCV log10: 6.373 log10 IU/mL
Hepatitis C Quantitation: 2360000 IU/mL

## 2023-06-29 LAB — HCV AB W REFLEX TO QUANT PCR: HCV Ab: REACTIVE — AB
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
## 2023-06-29 NOTE — Telephone Encounter (Signed)
Hospital inpatient EPDS=12. CSW consult completed during stay. Ambulatory IBH referral made now. Dr. Jolayne Panther notified via chart. In-basket message sent to [facility], LCSW. Paulene Floor, RN, 06/29/23, [phone]

## 2023-07-04 ENCOUNTER — Telehealth: Payer: Self-pay | Admitting: Neonatology

## 2023-07-04 NOTE — Telephone Encounter (Signed)
NAS postpartum telephone consult follow-up attempted with no answer. Maythe's phone number is not a home or cell phone number, but a company's phone number. Discussed with the NICU team our efforts of trying to reach Jeena and encouraged them to call us if she comes in to visit or call, as well as to pass along NAS phone number [phone]) if she does call and they are unable to reach [place].  Jason Fila, NNP-BC

## 2023-07-05 ENCOUNTER — Telehealth: Payer: Self-pay | Admitting: Clinical

## 2023-07-05 NOTE — Telephone Encounter (Signed)
Attempt call regarding referral; Did not leave voicemail, did not sound like personal phone number, but agency of some sort.

## 2023-07-12 ENCOUNTER — Telehealth: Payer: Self-pay

## 2023-07-12 NOTE — Telephone Encounter (Signed)
Called pt to follow up on positive Hep C result. MyChart message sent by provider but not read by patient. Per chart review last MyChart access by patient was 05/13/19.  Called listed phone number; VM message for a lumber and siding company heard, so no VM left. Called pt contact Ray who states he is not sure where Pamela Zimmerman is currently located. He will give her our name and phone number if he sees her. Per chart review, pt was unhoused at time of delivery. Provider notified.

## 2023-07-16 ENCOUNTER — Ambulatory Visit
Admission: EM | Admit: 2023-07-16 | Discharge: 2023-07-16 | Disposition: A | Discharge status: Home / Self Care | Payer: 59 | Attending: Internal Medicine | Admitting: Internal Medicine

## 2023-07-16 DIAGNOSIS — R109 Unspecified abdominal pain: Secondary | ICD-10-CM | POA: Insufficient documentation

## 2023-07-16 DIAGNOSIS — N3000 Acute cystitis without hematuria: Secondary | ICD-10-CM

## 2023-07-16 DIAGNOSIS — R300 Dysuria: Secondary | ICD-10-CM

## 2023-07-16 DIAGNOSIS — R10A0 Flank pain, unspecified side: Secondary | ICD-10-CM

## 2023-07-16 LAB — POCT URINALYSIS DIP (MANUAL ENTRY)
Bilirubin, UA: NEGATIVE
Glucose, UA: NEGATIVE mg/dL
Ketones, POC UA: NEGATIVE mg/dL
Nitrite, UA: NEGATIVE
Protein Ur, POC: 30 mg/dL — AB
Spec Grav, UA: 1.02 (ref 1.010–1.025)
Urobilinogen, UA: 0.2 E.U./dL
pH, UA: 5.5 (ref 5.0–8.0)

## 2023-07-16 MED ORDER — SULFAMETHOXAZOLE-TRIMETHOPRIM 800-160 MG PO TABS: 1.0000 | ORAL_TABLET | Freq: Two times a day (BID) | ORAL | 0 refills | Status: AC

## 2023-07-16 NOTE — Discharge Instructions (Signed)
I have prescribed you an antibiotic for UTI.  Urine culture is pending.  We will call when it results.  Please follow-up if any symptoms persist or worsen.

## 2023-07-16 NOTE — ED Provider Notes (Signed)
[name]    CSN: [id number] Arrival date & time: 07/16/23  1813      History   Chief Complaint Chief Complaint  Patient presents with   Urinary Tract Infection   Back Pain    HPI Pamela Zimmerman is a 37 y.o. female.   Patient presents with 1 week history of urinary burning and bilateral flank pain.  Denies vaginal discharge, abdominal pain, fever, chills.  Reports that she recently gave birth on 06/27/2023 and is still currently having vaginal bleeding.  Patient reports that she has not been sexually active so there is no concern for STD or pregnancy.   Urinary Tract Infection Back Pain   Past Medical History:  Diagnosis Date   Depression    had post partum depressin with last delivery   Drug abuse (HCC)    Hepatitis C    Heroin abuse (HCC)     Patient Active Problem List   Diagnosis Date Noted   Nexplanon in place 06/28/2023   SVD (spontaneous vaginal delivery) 06/27/2023   Indication for care in labor or delivery 06/26/2023   Gram-negative bacteremia 10/12/2019   Chronic hepatitis C (HCC) 09/16/2019   Right tubal pregnancy without intrauterine pregnancy 09/15/2019   Threatened miscarriage in early pregnancy 02/19/2019   Substance abuse affecting pregnancy in first trimester, antepartum (HCC) 02/19/2019   Cellulitis 01/08/2019   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   Right hand       OB History     Gravida  4   Para  3   Term  1   Preterm  1   AB      Living  3      SAB      IAB      Ectopic      Multiple  0   Live Births  3            Home Medications    Prior to Admission medications   Medication Sig Start Date End Date Taking? Authorizing Provider  sulfamethoxazole-trimethoprim (BACTRIM DS) 800-160 MG tablet Take 1 tablet by mouth 2 (two) times daily for 7 days. 07/16/23 07/23/23 Yes Raeley Gilmore, Acie Fredrickson, FNP  Ascorbic Acid (VITAMIN C) 100 MG tablet Take 100 mg by mouth daily.    [provider]  ibuprofen (ADVIL) 600 MG tablet Take 1 tablet (600 mg total) by mouth every 6 (six) hours as needed for up to 30 doses for mild pain or moderate pain. 09/22/20   Terald Sleeper, MD  Multiple Vitamin (MULTIVITAMIN) tablet Take 1 tablet by mouth daily.    [provider]    Family History Family History  Problem Relation Age of Onset   Cancer Mother        lung   Diabetes Father    Stroke Father    Deafness Brother    Cystic fibrosis Other  Social History Social History   Tobacco Use   Smoking status: Every Day    Current packs/day: 0.50    Types: Cigarettes   Smokeless tobacco: Never  Vaping Use   Vaping status: Never Used  Substance Use Topics   Alcohol use: No   Drug use: Yes    Types: Benzodiazepines, IV    Comment: heroin, Fentanyl     Allergies   Patient has no known allergies.   Review of Systems Review of Systems Per HPI  Physical Exam Triage Vital Signs ED Triage Vitals [07/16/23 1820]  Encounter Vitals Group     BP 114/72     Systolic BP Percentile      Diastolic BP Percentile      Pulse Rate (!) 114     Resp 16     Temp 98.5 F (36.9 C)     Temp Source Oral     SpO2 97 %     Weight      Height      Head Circumference      Peak Flow      Pain Score      Pain Loc      Pain Education      Exclude from Growth Chart    No data found.  Updated Vital Signs BP 114/72 (BP  Location: Left Arm)   Pulse (!) 114   Temp 98.5 F (36.9 C) (Oral)   Resp 16   LMP 07/16/2023   SpO2 97%   Breastfeeding Unknown   Visual Acuity Right Eye Distance:   Left Eye Distance:   Bilateral Distance:    Right Eye Near:   Left Eye Near:    Bilateral Near:     Physical Exam Constitutional:      General: She is not in acute distress.    Appearance: Normal appearance. She is not toxic-appearing or diaphoretic.  HENT:     Head: Normocephalic and atraumatic.  Eyes:     Extraocular Movements: Extraocular movements intact.     Conjunctiva/sclera: Conjunctivae normal.  Pulmonary:     Effort: Pulmonary effort is normal.  Musculoskeletal:     Comments: Very mild tenderness to palpation to bilateral flanks.  Neurological:     General: No focal deficit present.     Mental Status: She is alert and oriented to person, place, and time. Mental status is at baseline.  Psychiatric:        Mood and Affect: Mood normal.        Behavior: Behavior normal.        Thought Content: Thought content normal.        Judgment: Judgment normal.      UC Treatments / Results  Labs (all labs ordered are listed, but only abnormal results are displayed) Labs Reviewed  POCT URINALYSIS DIP (MANUAL ENTRY) - Abnormal; Notable for the following components:      Result Value   Blood, UA large (*)    Protein Ur, POC =30 (*)    Leukocytes, UA Small (1+) (*)    All other components within normal limits  URINE CULTURE    EKG   Radiology No results found.  Procedures Procedures (including critical care time)  Medications Ordered in UC Medications - No data to display  Initial Impression / Assessment and Plan / UC Course  I have reviewed the triage vital signs and the nursing notes.  Pertinent labs & imaging results that were available during my care of the patient  were reviewed by me and considered in my medical decision making (see chart for details).     UA indicating possible  urinary tract infection.  Given bilateral flank pain, will opt to treat with Bactrim.  Urine culture is pending.  Advised follow-up if symptoms persist or worsen.  Also advised strict ER precautions.  It would probably be best for patient to follow-up with her gynecologist as well given she recently gave birth.  She denies that she is breast-feeding so Bactrim so should be safe.  Patient verbalized understanding and was agreeable with plan. Final Clinical Impressions(s) / UC Diagnoses   Final diagnoses:  Acute cystitis without hematuria  Dysuria  Flank pain     Discharge Instructions      I have prescribed you an antibiotic for UTI.  Urine culture is pending.  We will call when it results.  Please follow-up if any symptoms persist or worsen.    ED Prescriptions     Medication Sig Dispense Auth. Provider   sulfamethoxazole-trimethoprim (BACTRIM DS) 800-160 MG tablet Take 1 tablet by mouth 2 (two) times daily for 7 days. 14 tablet [place], [place], [place]      I have reviewed the PDMP during this encounter.   Gustavus Bryant, [place] 07/16/23 2011

## 2023-07-16 NOTE — ED Triage Notes (Signed)
Pt reports lower back pain x 1 week.Pt thinks she has a kidney infection.

## 2023-07-22 ENCOUNTER — Telehealth (HOSPITAL_COMMUNITY): Payer: Self-pay | Admitting: *Deleted

## 2023-07-22 NOTE — Telephone Encounter (Signed)
07/22/2023  Name: LASHANN DAIRE MRN: [id number] DOB: 10/27/1986  Reason for Call:  Transition of Care Hospital Discharge Call  Contact Status: Patient Contact Status: Unable to contact  Voice mail box was full, unable to leave a message.  Language assistant needed:          Follow-Up Questions:    Inocente Salles Postnatal Depression Scale:  In the Past 7 Days:    PHQ2-9 Depression Scale:     Discharge Follow-up:    Post-discharge interventions: NA  Salena Saner, RN 07/22/2023 13:01

## 2023-08-12 ENCOUNTER — Encounter: Payer: 59 | Admitting: Family Medicine

## 2023-09-06 ENCOUNTER — Telehealth: Payer: Self-pay | Admitting: Clinical

## 2023-09-06 NOTE — Telephone Encounter (Signed)
Attempt call regarding referral; Unable to reach/"call cannot be completed", no message left

## 2023-09-10 ENCOUNTER — Telehealth: Payer: Self-pay | Admitting: Pediatrics

## 2023-09-10 NOTE — Telephone Encounter (Signed)
NAS telephone attempt completed.  Phone number not in service. Patient can call NAs consult line at [phone] if needs arise. Will follow again on 10/20.

## 2023-12-11 ENCOUNTER — Emergency Department (HOSPITAL_COMMUNITY)
Admission: EM | Admit: 2023-12-11 | Discharge: 2023-12-11 | Disposition: A | Discharge status: Home / Self Care | Payer: MEDICAID | Attending: Emergency Medicine | Admitting: Emergency Medicine

## 2023-12-11 ENCOUNTER — Other Ambulatory Visit: Payer: Self-pay

## 2023-12-11 ENCOUNTER — Encounter (HOSPITAL_COMMUNITY): Payer: Self-pay

## 2023-12-11 DIAGNOSIS — F419 Anxiety disorder, unspecified: Secondary | ICD-10-CM | POA: Insufficient documentation

## 2023-12-11 DIAGNOSIS — F1910 Other psychoactive substance abuse, uncomplicated: Secondary | ICD-10-CM | POA: Insufficient documentation

## 2023-12-11 DIAGNOSIS — T50901A Poisoning by unspecified drugs, medicaments and biological substances, accidental (unintentional), initial encounter: Secondary | ICD-10-CM

## 2023-12-11 DIAGNOSIS — Y900 Blood alcohol level of less than 20 mg/100 ml: Secondary | ICD-10-CM | POA: Diagnosis not present

## 2023-12-11 DIAGNOSIS — T43621A Poisoning by amphetamines, accidental (unintentional), initial encounter: Secondary | ICD-10-CM | POA: Insufficient documentation

## 2023-12-11 DIAGNOSIS — R000 Tachycardia, unspecified: Secondary | ICD-10-CM | POA: Insufficient documentation

## 2023-12-11 LAB — CBC WITH DIFFERENTIAL/PLATELET
Abs Immature Granulocytes: 0.01 10*3/uL (ref 0.00–0.07)
Basophils Absolute: 0 10*3/uL (ref 0.0–0.1)
Basophils Relative: 0 %
Eosinophils Absolute: 0.1 10*3/uL (ref 0.0–0.5)
Eosinophils Relative: 1 %
HCT: 42.2 % (ref 36.0–46.0)
Hemoglobin: 14.5 g/dL (ref 12.0–15.0)
Immature Granulocytes: 0 %
Lymphocytes Relative: 31 %
Lymphs Abs: 2.4 10*3/uL (ref 0.7–4.0)
MCH: 29.2 pg (ref 26.0–34.0)
MCHC: 34.4 g/dL (ref 30.0–36.0)
MCV: 85.1 fL (ref 80.0–100.0)
Monocytes Absolute: 0.6 10*3/uL (ref 0.1–1.0)
Monocytes Relative: 7 %
Neutro Abs: 4.5 10*3/uL (ref 1.7–7.7)
Neutrophils Relative %: 61 %
Platelets: 282 10*3/uL (ref 150–400)
RBC: 4.96 MIL/uL (ref 3.87–5.11)
RDW: 15 % (ref 11.5–15.5)
WBC: 7.5 10*3/uL (ref 4.0–10.5)
nRBC: 0 % (ref 0.0–0.2)

## 2023-12-11 LAB — COMPREHENSIVE METABOLIC PANEL
ALT: 51 U/L — ABNORMAL HIGH (ref 0–44)
AST: 36 U/L (ref 15–41)
Albumin: 4.5 g/dL (ref 3.5–5.0)
Alkaline Phosphatase: 68 U/L (ref 38–126)
Anion gap: 10 (ref 5–15)
BUN: 16 mg/dL (ref 6–20)
CO2: 23 mmol/L (ref 22–32)
Calcium: 9 mg/dL (ref 8.9–10.3)
Chloride: 103 mmol/L (ref 98–111)
Creatinine, Ser: 1.07 mg/dL — ABNORMAL HIGH (ref 0.44–1.00)
GFR, Estimated: >60 mL/min (ref >60)
Glucose, Bld: 147 mg/dL — ABNORMAL HIGH (ref 70–99)
Potassium: 3.9 mmol/L (ref 3.5–5.1)
Sodium: 136 mmol/L (ref 135–145)
Total Bilirubin: 1 mg/dL (ref <1.2)
Total Protein: 8.2 g/dL — ABNORMAL HIGH (ref 6.5–8.1)

## 2023-12-11 LAB — ETHANOL: Alcohol, Ethyl (B): <10 mg/dL (ref <10)

## 2023-12-11 MED ORDER — SODIUM CHLORIDE 0.9 % IV BOLUS
1000.0000 mL | Freq: Once | INTRAVENOUS | Status: AC
  Administered 2023-12-11: 1000 mL via INTRAVENOUS

## 2023-12-11 MED ORDER — METOPROLOL TARTRATE 5 MG/5ML IV SOLN
5.0000 mg | Freq: Once | INTRAVENOUS | Status: AC
  Administered 2023-12-11: 5 mg via INTRAVENOUS
  Filled 2023-12-11: qty 5

## 2023-12-11 NOTE — ED Provider Notes (Signed)
 EMERGENCY DEPARTMENT AT [facility] Provider Note   CSN: [id number] Arrival date & time: 12/11/23  1130     History {Add pertinent medical, surgical, social history, OB history to HPI:1} Chief Complaint  Patient presents with   Drug Overdose    Pamela Zimmerman is a 37 y.o. female.  Patient with a history of substance abuse.  [place] for [place].  Patient did not receive any Narcan.  Patient also has been using methamphetamines.   Drug Overdose       Home Medications Prior to Admission medications   Medication Sig Start Date End Date Taking? Authorizing Provider  Ascorbic Acid (VITAMIN C) 100 MG tablet Take 100 mg by mouth daily.    [provider]  ibuprofen (ADVIL) 600 MG tablet Take 1 tablet (600 mg total) by mouth every 6 (six) hours as needed for up to 30 doses for mild pain or moderate pain. 09/22/20   Terald Sleeper, MD  Multiple Vitamin (MULTIVITAMIN) tablet Take 1 tablet by mouth daily.    [provider]      Allergies    Patient has no known allergies.    Review of Systems   Review of Systems  Physical Exam Updated Vital Signs BP (!) 126/98   Pulse (!) 157   Temp 99 F (37.2 C) (Oral)   Resp (!) 22   SpO2 95%  Physical Exam  ED Results / Procedures / Treatments   Labs (all labs ordered are listed, but only abnormal results are displayed) Labs Reviewed  COMPREHENSIVE METABOLIC PANEL - Abnormal; Notable for the following components:      Result Value   Glucose, Bld 147 (*)    Creatinine, Ser 1.07 (*)    Total Protein 8.2 (*)    ALT 51 (*)    All other components within normal limits  CBC WITH DIFFERENTIAL/PLATELET  ETHANOL  RAPID URINE DRUG SCREEN, HOSP PERFORMED    EKG None  Radiology No results found.  Procedures Procedures  {Document cardiac monitor, telemetry assessment procedure when appropriate:1}  Medications Ordered in  ED Medications  sodium chloride 0.9 % bolus 1,000 mL (1,000 mLs Intravenous New Bag/Given 12/11/23 1237)  metoprolol tartrate (LOPRESSOR) injection 5 mg (5 mg Intravenous Given 12/11/23 1238)    ED Course/ Medical Decision Making/ A&P   {   Click here for ABCD2, HEART and other calculatorsREFRESH Note before signing :1}                              Medical Decision Making Amount and/or Complexity of Data Reviewed Labs: ordered. ECG/medicine tests: ordered.  Risk Prescription drug management.   Patient with substance abuse from narcotics and methamphetamines.  She is discharged home to follow-up with behavioral health and is given a primary care doctor  {Document critical care time when appropriate:1} {Document review of labs and clinical decision tools ie heart score, Chads2Vasc2 etc:1}  {Document your independent review of radiology images, and any outside records:1} {Document your discussion with family members, caretakers, and with consultants:1} {Document social determinants of health affecting pt's care:1} {Document your decision making why or why not admission, treatments were needed:1} Final Clinical Impression(s) / ED Diagnoses Final diagnoses:  Accidental overdose, initial encounter  Polysubstance abuse (HCC)    Rx / DC Orders ED Discharge Orders     None

## 2023-12-11 NOTE — ED Notes (Signed)
BP not picking up due to pt sitting on edge of bed and moving. Multiple attempts made.

## 2023-12-11 NOTE — Discharge Instructions (Signed)
Follow-up with [facility] for [facility] practice.  You have also been referred to [facility] gastroenterology for your hepatitis C.  Do not use any more drugs.  Return if problems.  You can also go to the behavioral health urgent care for behavioral health problems

## 2023-12-11 NOTE — ED Triage Notes (Signed)
[place]. Pt is now A&O, no narcan was given. 150 HR 148/90 BP 193 cbg 97% room air

## 2023-12-21 ENCOUNTER — Ambulatory Visit
Admission: EM | Admit: 2023-12-21 | Discharge: 2023-12-21 | Disposition: A | Discharge status: Home / Self Care | Payer: MEDICAID | Attending: Internal Medicine | Admitting: Internal Medicine

## 2023-12-21 DIAGNOSIS — N898 Other specified noninflammatory disorders of vagina: Secondary | ICD-10-CM | POA: Diagnosis not present

## 2023-12-21 DIAGNOSIS — M545 Low back pain, unspecified: Secondary | ICD-10-CM | POA: Diagnosis not present

## 2023-12-21 DIAGNOSIS — R300 Dysuria: Secondary | ICD-10-CM | POA: Insufficient documentation

## 2023-12-21 LAB — POCT URINALYSIS DIP (MANUAL ENTRY)
Blood, UA: NEGATIVE
Glucose, UA: NEGATIVE mg/dL
Ketones, POC UA: NEGATIVE mg/dL
Leukocytes, UA: NEGATIVE
Nitrite, UA: NEGATIVE
Protein Ur, POC: 30 mg/dL — AB
Spec Grav, UA: >=1.03 — AB (ref 1.010–1.025)
Urobilinogen, UA: 1 U/dL
pH, UA: 5.5 (ref 5.0–8.0)

## 2023-12-21 NOTE — ED Triage Notes (Signed)
"  For a few wks burning with urination, now back pain (right/left lower) & vaginal discharge". No fever.

## 2023-12-22 LAB — URINE CULTURE: Culture: NO GROWTH

## 2023-12-23 LAB — CERVICOVAGINAL ANCILLARY ONLY
Bacterial Vaginitis (gardnerella): POSITIVE — AB
Candida Glabrata: NEGATIVE
Candida Vaginitis: POSITIVE — AB
Chlamydia: NEGATIVE
Comment: NEGATIVE
Comment: NEGATIVE
Comment: NEGATIVE
Comment: NEGATIVE
Comment: NEGATIVE
Comment: NORMAL
Neisseria Gonorrhea: NEGATIVE
Trichomonas: NEGATIVE

## 2023-12-24 ENCOUNTER — Telehealth (HOSPITAL_COMMUNITY): Payer: Self-pay | Admitting: Emergency Medicine

## 2023-12-24 MED ORDER — FLUCONAZOLE 150 MG PO TABS: 150.0000 mg | ORAL_TABLET | Freq: Once | ORAL | 0 refills | Status: AC

## 2023-12-24 NOTE — Telephone Encounter (Signed)
 Diflucan for positive yeast, per protocol

## 2023-12-26 NOTE — ED Provider Notes (Signed)
 [name]    CSN: [id number] Arrival date & time: 12/21/23  0932      History   Chief Complaint Chief Complaint  Patient presents with   UTI Symptoms   Back Pain    HPI Pamela Zimmerman is a 38 y.o. female.   Patient here today for evaluation of dysuria that started a few weeks ago.  She reports that now she has developed lower back pain as well as vaginal discharge.  She has not any fever.  She denies any known STD exposures.  The history is provided by the patient.  Back Pain Associated symptoms: dysuria   Associated symptoms: no abdominal pain and no fever     Past Medical History:  Diagnosis Date   Depression    had post partum depressin with last delivery   Drug abuse (HCC)    Hepatitis C    Heroin abuse (HCC)     Patient Active Problem List   Diagnosis Date Noted   Nexplanon  in place 06/28/2023   SVD (spontaneous vaginal delivery) 06/27/2023   Indication for care in labor or delivery 06/26/2023   Gram-negative bacteremia 10/12/2019   Chronic hepatitis C (HCC) 09/16/2019   Cellulitis of right forearm 09/16/2019   Dysuria 09/16/2019   Hyponatremia 09/16/2019   Leukocytosis 09/16/2019   Microcytosis 09/16/2019   Wheezing 09/16/2019   Opiate dependence (HCC) 09/16/2019   Right tubal pregnancy without intrauterine pregnancy 09/15/2019   Abscess of skin of left ankle 09/15/2019   Cellulitis of left ankle 09/15/2019   Cutaneous abscess of left lower extremity 09/15/2019   Threatened miscarriage in early pregnancy 02/19/2019   Substance abuse affecting pregnancy in first trimester, antepartum (HCC) 02/19/2019   Cellulitis 01/08/2019   Puncture wound of right hand with infection 01/07/2019   Heroin abuse (HCC) 05/04/2018   Preterm labor 06/17/2017   Group B streptococcal carriage complicating pregnancy 06/08/2017   Opioid use disorder, severe, dependence (HCC) 06/07/2017   Gonorrhea affecting pregnancy in third trimester 06/07/2017   Heroin  use affecting pregnancy 06/05/2017   IVDA (intravenous drug abuse) complicating pregnancy (HCC) 06/04/2017   Hepatitis C antibody test positive 06/04/2017   No prenatal care in current pregnancy in third trimester 06/04/2017   Supervision of high risk pregnancy in third trimester 06/04/2017   H/O preterm delivery, currently pregnant, third trimester 06/04/2017   Thrombophlebitis arm 01/29/2017   Tobacco use disorder 01/05/2017   Pregnancy 03/17/2013   Depression 03/02/2013    Past Surgical History:  Procedure Laterality Date   CHOLECYSTECTOMY     HAND SURGERY Left    I & D EXTREMITY Right 01/08/2019   Procedure: IRRIGATION AND DEBRIDEMENT, RIGHT HAND;  Surgeon: Sebastian Lenis, MD;  Location: [facility] OR;  Service: Orthopedics;  Laterality: Right;   LEG SURGERY Left    Growth was removed.   Right hand      OB History     Gravida  4   Para  3   Term  1   Preterm  1   AB      Living  3      SAB      IAB      Ectopic      Multiple  0   Live Births  3            Home Medications    Prior to Admission medications   Medication Sig Start Date End Date Taking? Authorizing Provider  etonogestrel  (NEXPLANON ) 68 MG IMPL implant  1 each by Subdermal route once. Left Arm.   Yes [provider]  Ascorbic Acid (VITAMIN C) 100 MG tablet Take 100 mg by mouth daily.    [provider]  ibuprofen  (ADVIL ) 600 MG tablet Take 1 tablet (600 mg total) by mouth every 6 (six) hours as needed for up to 30 doses for mild pain or moderate pain. 09/22/20   Cottie Donnice PARAS, MD  Multiple Vitamin (MULTIVITAMIN) tablet Take 1 tablet by mouth daily.    [provider]    Family History Family History  Problem Relation Age of Onset   Cancer Mother        lung   Diabetes Father    Stroke Father    Deafness Brother    Cystic fibrosis Other     Social History Social History   Tobacco Use   Smoking status: Former    Current packs/day: 0.50    Types:  Cigarettes    Passive exposure: Past   Smokeless tobacco: Never  Vaping Use   Vaping status: Never Used  Substance Use Topics   Alcohol use: No   Drug use: Not Currently    Types: Benzodiazepines, IV    Comment: heroin, Fentanyl      Allergies   Patient has no known allergies.   Review of Systems Review of Systems  Constitutional:  Negative for chills and fever.  Eyes:  Negative for discharge and redness.  Respiratory:  Negative for shortness of breath.   Gastrointestinal:  Negative for abdominal pain, nausea and vomiting.  Genitourinary:  Positive for dysuria and vaginal discharge.  Musculoskeletal:  Positive for back pain.     Physical Exam Triage Vital Signs ED Triage Vitals  Encounter Vitals Group     BP 12/21/23 1038 94/60     Systolic BP Percentile --      Diastolic BP Percentile --      Pulse Rate 12/21/23 1038 91     Resp 12/21/23 1038 18     Temp 12/21/23 1038 98.5 F (36.9 C)     Temp Source 12/21/23 1038 Oral     SpO2 12/21/23 1038 96 %     Weight 12/21/23 1035 140 lb (63.5 kg)     Height 12/21/23 1035 5' 11 (1.803 m)     Head Circumference --      Peak Flow --      Pain Score 12/21/23 1034 7     Pain Loc --      Pain Education --      Exclude from Growth Chart --    No data found.  Updated Vital Signs BP 94/60 (BP Location: Left Arm)   Pulse 91   Temp 98.5 F (36.9 C) (Oral)   Resp 18   Ht 5' 11 (1.803 m)   Wt 140 lb (63.5 kg)   LMP  (LMP Unknown)   SpO2 96%   BMI 19.53 kg/m   Visual Acuity Right Eye Distance:   Left Eye Distance:   Bilateral Distance:    Right Eye Near:   Left Eye Near:    Bilateral Near:     Physical Exam Vitals and nursing note reviewed.  Constitutional:      General: She is not in acute distress.    Appearance: Normal appearance. She is not ill-appearing.  HENT:     Head: Normocephalic and atraumatic.  Eyes:     Conjunctiva/sclera: Conjunctivae normal.  Cardiovascular:     Rate and Rhythm: Normal  rate.  Pulmonary:     Effort: Pulmonary effort is normal. No respiratory distress.  Neurological:     Mental Status: She is alert.  Psychiatric:        Mood and Affect: Mood normal.        Behavior: Behavior normal.        Thought Content: Thought content normal.      UC Treatments / Results  Labs (all labs ordered are listed, but only abnormal results are displayed) Labs Reviewed  POCT URINALYSIS DIP (MANUAL ENTRY) - Abnormal; Notable for the following components:      Result Value   Color, UA straw (*)    Clarity, UA cloudy (*)    Bilirubin, UA small (*)    Spec Grav, UA >=1.030 (*)    Protein Ur, POC =30 (*)    All other components within normal limits  CERVICOVAGINAL ANCILLARY ONLY - Abnormal; Notable for the following components:   Bacterial Vaginitis (gardnerella) Positive (*)    Candida Vaginitis Positive (*)    All other components within normal limits  URINE CULTURE    EKG   Radiology No results found.  Procedures Procedures (including critical care time)  Medications Ordered in UC Medications - No data to display  Initial Impression / Assessment and Plan / UC Course  I have reviewed the triage vital signs and the nursing notes.  Pertinent labs & imaging results that were available during my care of the patient were reviewed by me and considered in my medical decision making (see chart for details).   UA without concerning findings for UTI.  Will order urine culture.  Screening for gonorrhea, chlamydia, trichomonas and BV and yeast also ordered.  Will await results further recommendation.  Encouraged follow-up with any further concerns.   Final Clinical Impressions(s) / UC Diagnoses   Final diagnoses:  Dysuria   Discharge Instructions   None    ED Prescriptions   None    PDMP not reviewed this encounter.   Shannara, [place], PA-C 12/26/23 [phone]

## 2024-03-07 ENCOUNTER — Encounter: Payer: MEDICAID | Admitting: Family

## 2024-03-07 NOTE — Progress Notes (Signed)
   This encounter was created in error - please disregard. No show

## 2024-03-24 ENCOUNTER — Encounter: Payer: Self-pay | Admitting: Emergency Medicine

## 2024-03-24 ENCOUNTER — Ambulatory Visit
Admission: EM | Admit: 2024-03-24 | Discharge: 2024-03-24 | Disposition: A | Discharge status: Home / Self Care | Payer: MEDICAID | Attending: Internal Medicine | Admitting: Internal Medicine

## 2024-03-24 DIAGNOSIS — N898 Other specified noninflammatory disorders of vagina: Secondary | ICD-10-CM | POA: Diagnosis not present

## 2024-03-24 DIAGNOSIS — R350 Frequency of micturition: Secondary | ICD-10-CM | POA: Insufficient documentation

## 2024-03-24 DIAGNOSIS — R300 Dysuria: Secondary | ICD-10-CM | POA: Insufficient documentation

## 2024-03-24 DIAGNOSIS — Z113 Encounter for screening for infections with a predominantly sexual mode of transmission: Secondary | ICD-10-CM | POA: Diagnosis not present

## 2024-03-24 DIAGNOSIS — F17210 Nicotine dependence, cigarettes, uncomplicated: Secondary | ICD-10-CM | POA: Diagnosis not present

## 2024-03-24 DIAGNOSIS — Z3202 Encounter for pregnancy test, result negative: Secondary | ICD-10-CM

## 2024-03-24 LAB — POCT URINE PREGNANCY: Preg Test, Ur: NEGATIVE

## 2024-03-24 LAB — POCT URINALYSIS DIP (MANUAL ENTRY)
Blood, UA: NEGATIVE
Glucose, UA: NEGATIVE mg/dL
Ketones, POC UA: NEGATIVE mg/dL
Nitrite, UA: NEGATIVE
Protein Ur, POC: 100 mg/dL — AB
Spec Grav, UA: >=1.03 — AB (ref 1.010–1.025)
Urobilinogen, UA: 0.2 U/dL
pH, UA: 5.5 (ref 5.0–8.0)

## 2024-03-24 MED ORDER — METRONIDAZOLE 500 MG PO TABS: 500.0000 mg | ORAL_TABLET | Freq: Two times a day (BID) | ORAL | 0 refills | Status: DC

## 2024-03-24 MED ORDER — FLUCONAZOLE 150 MG PO TABS: 150.0000 mg | ORAL_TABLET | ORAL | 0 refills | Status: DC

## 2024-03-24 NOTE — Discharge Instructions (Addendum)
 I have sent you 2 medications for symptoms.  Urine culture and vaginal swab are pending.  We will call if they are abnormal.  Please see attached address for urgent care to help with getting Suboxone.

## 2024-03-24 NOTE — ED Provider Notes (Signed)
 [name]    CSN: [id number] Arrival date & time: 03/24/24  1301      History   Chief Complaint Chief Complaint  Patient presents with   Flank Pain   Vaginal Discharge   Dysuria    HPI Pamela Zimmerman is a 38 y.o. female.   Patient presents with dysuria, urinary frequency, white vaginal discharge, bilateral flank pain that started about 2 weeks ago.  Patient had similar symptoms a few months prior and tested positive for bacterial vaginosis and vaginal yeast.  Has had unprotected sexual intercourse approximately 1 month ago but denies exposure to STD.  Last menstrual cycle was multiple years ago as she has Nexplanon in place.  Patient also requesting information for  Suboxone clinic as she is trying to quit heroin.  Reports that she is no longer using heroin and has been buying Suboxone off the streets.   Flank Pain  Vaginal Discharge Dysuria   Past Medical History:  Diagnosis Date   Depression    had post partum depressin with last delivery   Drug abuse (HCC)    Hepatitis C    Heroin abuse (HCC)     Patient Active Problem List   Diagnosis Date Noted   Nexplanon in place 06/28/2023   SVD (spontaneous vaginal delivery) 06/27/2023   Indication for care in labor or delivery 06/26/2023   Gram-negative bacteremia 10/12/2019   Chronic hepatitis C (HCC) 09/16/2019   Cellulitis of right forearm 09/16/2019   Dysuria 09/16/2019   Hyponatremia 09/16/2019   Leukocytosis 09/16/2019   Microcytosis 09/16/2019   Wheezing 09/16/2019   Opiate dependence (HCC) 09/16/2019   Right tubal pregnancy without intrauterine pregnancy 09/15/2019   Abscess of skin of left ankle 09/15/2019   Cellulitis of left ankle 09/15/2019   Cutaneous abscess of left lower extremity 09/15/2019   Threatened miscarriage in early pregnancy 02/19/2019   Substance abuse affecting pregnancy in first trimester, antepartum (HCC) 02/19/2019   Cellulitis 01/08/2019   Puncture wound of right hand  with infection 01/07/2019   Heroin abuse (HCC) 05/04/2018   Preterm labor 06/17/2017   Group B streptococcal carriage complicating pregnancy 06/08/2017   Opioid use disorder, severe, dependence (HCC) 06/07/2017   Gonorrhea affecting pregnancy in third trimester 06/07/2017   Heroin use affecting pregnancy 06/05/2017   IVDA (intravenous drug abuse) complicating pregnancy (HCC) 06/04/2017   Hepatitis C antibody test positive 06/04/2017   No prenatal care in current pregnancy in third trimester 06/04/2017   Supervision of high risk pregnancy in third trimester 06/04/2017   H/O preterm delivery, currently pregnant, third trimester 06/04/2017   Thrombophlebitis arm 01/29/2017   Tobacco use disorder 01/05/2017   Pregnancy 03/17/2013   Depression 03/02/2013    Past Surgical History:  Procedure Laterality Date   CHOLECYSTECTOMY     HAND SURGERY Left    I & D EXTREMITY Right 01/08/2019   Procedure: IRRIGATION AND DEBRIDEMENT, RIGHT HAND;  Surgeon: Mack Hook, MD;  Location: [facility] OR;  Service: Orthopedics;  Laterality: Right;   LEG SURGERY Left    Growth was removed.   Right hand      OB History     Gravida  4   Para  3   Term  1   Preterm  1   AB      Living  3      SAB      IAB      Ectopic      Multiple  0   Live  Births  3            Home Medications    Prior to Admission medications   Medication Sig Start Date End Date Taking? Authorizing Provider  etonogestrel (NEXPLANON) 68 MG IMPL implant 1 each by Subdermal route once. Left Arm.   Yes [provider]  fluconazole (DIFLUCAN) 150 MG tablet Take 1 tablet (150 mg total) by mouth every 3 (three) days. 03/24/24  Yes Nash Bolls, Rolly Salter E, FNP  ibuprofen (ADVIL) 600 MG tablet Take 1 tablet (600 mg total) by mouth every 6 (six) hours as needed for up to 30 doses for mild pain or moderate pain. 09/22/20  Yes Trifan, Kermit Balo, MD  metroNIDAZOLE (FLAGYL) 500 MG tablet Take 1 tablet (500 mg total) by mouth 2  (two) times daily. 03/24/24  Yes Claus Silvestro, Rolly Salter E, FNP  Ascorbic Acid (VITAMIN C) 100 MG tablet Take 100 mg by mouth daily. Patient not taking: Reported on 03/24/2024    [provider]  Multiple Vitamin (MULTIVITAMIN) tablet Take 1 tablet by mouth daily. Patient not taking: Reported on 03/24/2024    [provider]  nitrofurantoin (MACRODANTIN) 100 MG capsule Take by mouth. Patient not taking: Reported on 03/24/2024 10/12/19   [provider]    Family History Family History  Problem Relation Age of Onset   Cancer Mother        lung   Diabetes Father    Stroke Father    Deafness Brother    Cystic fibrosis Other     Social History Social History   Tobacco Use   Smoking status: Some Days    Current packs/day: 0.50    Types: Cigarettes    Passive exposure: Current   Smokeless tobacco: Never  Vaping Use   Vaping status: Some Days   Substances: Nicotine  Substance Use Topics   Alcohol use: No   Drug use: Not Currently    Types: Benzodiazepines, IV    Comment: heroin, Fentanyl     Allergies   Patient has no known allergies.   Review of Systems Review of Systems Per HPI  Physical Exam Triage Vital Signs ED Triage Vitals [03/24/24 1339]  Encounter Vitals Group     BP 103/72     Systolic BP Percentile      Diastolic BP Percentile      Pulse Rate 93     Resp 16     Temp 98.7 F (37.1 C)     Temp Source Oral     SpO2 96 %     Weight      Height      Head Circumference      Peak Flow      Pain Score 7     Pain Loc      Pain Education      Exclude from Growth Chart    No data found.  Updated Vital Signs BP 103/72 (BP Location: Left Arm)   Pulse 93   Temp 98.7 F (37.1 C) (Oral)   Resp 16   LMP  (LMP Unknown)   SpO2 96%   Breastfeeding No   Visual Acuity Right Eye Distance:   Left Eye Distance:   Bilateral Distance:    Right Eye Near:   Left Eye Near:    Bilateral Near:     Physical Exam Constitutional:      General: She  is not in acute distress.    Appearance: Normal appearance. She is not toxic-appearing or diaphoretic.  Comments: Patient is sitting upright in chair in no acute distress.   HENT:     Head: Normocephalic and atraumatic.  Eyes:     Extraocular Movements: Extraocular movements intact.     Conjunctiva/sclera: Conjunctivae normal.  Pulmonary:     Effort: Pulmonary effort is normal.  Genitourinary:    Comments: Deferred with shared decision making.  Self swab performed. Musculoskeletal:     Comments: Mild tenderness to palpation to bilateral lower back.  No direct spinal tenderness, crepitus, step-off noted.  Neurological:     General: No focal deficit present.     Mental Status: She is alert and oriented to person, place, and time. Mental status is at baseline.  Psychiatric:        Mood and Affect: Mood normal.        Behavior: Behavior normal.        Thought Content: Thought content normal.        Judgment: Judgment normal.      UC Treatments / Results  Labs (all labs ordered are listed, but only abnormal results are displayed) Labs Reviewed  POCT URINALYSIS DIP (MANUAL ENTRY) - Abnormal; Notable for the following components:      Result Value   Clarity, UA hazy (*)    Bilirubin, UA small (*)    Spec Grav, UA >=1.030 (*)    Protein Ur, POC =100 (*)    Leukocytes, UA Trace (*)    All other components within normal limits  POCT URINE PREGNANCY - Normal  URINE CULTURE  CERVICOVAGINAL ANCILLARY ONLY    EKG   Radiology No results found.  Procedures Procedures (including critical care time)  Medications Ordered in UC Medications - No data to display  Initial Impression / Assessment and Plan / UC Course  I have reviewed the triage vital signs and the nursing notes.  Pertinent labs & imaging results that were available during my care of the patient were reviewed by me and considered in my medical decision making (see chart for details).     UA showing trace  leukocytes but I am most suspicious of vaginitis causing this given associated vaginal discharge and patient having similar symptoms a few months prior with positive bacterial vaginosis and vaginal yeast on vaginal swab.  Therefore, will send urine culture and vaginal swab, and I am going to err on the side of caution and opt to treat with Flagyl and Diflucan today to treat any associated vaginitis while awaiting results given history.  Advised patient to take medications with food and should avoid alcohol.  Patient requesting information on Suboxone clinic.  Provided patient with information for behavioral health urgent care for follow-up as soon as possible.  She is not presenting with signs of withdrawal at this time so I do not think that emergency department evaluation is necessary. Advised strict follow up precautions. Patient verbalized understanding and was agreeable with plan. Final Clinical Impressions(s) / UC Diagnoses   Final diagnoses:  Dysuria  Urinary frequency  Vaginal discharge  Screening examination for venereal disease  Urine pregnancy test negative     Discharge Instructions      I have sent you 2 medications for symptoms.  Urine culture and vaginal swab are pending.  We will call if they are abnormal.  Please see attached address for urgent care to help with getting Suboxone.     ED Prescriptions     Medication Sig Dispense Auth. Provider   metroNIDAZOLE (FLAGYL) 500 MG tablet Take 1  tablet (500 mg total) by mouth 2 (two) times daily. 14 tablet [place], Elim E, [place]   fluconazole (DIFLUCAN) 150 MG tablet Take 1 tablet (150 mg total) by mouth every 3 (three) days. 2 tablet [place], Acie Fredrickson, [place]      PDMP not reviewed this encounter.   Gustavus Bryant, [place] 03/24/24 1459

## 2024-03-24 NOTE — ED Triage Notes (Signed)
 Pt reports bilateral flank pain x2 weeks. Intermittent throbbing pain. Also reports burning dysuria, increased frequency, vaginal itching, and thick/white vaginal discharge over same period. Pt has no known exposures to STIs, but would like to check that as well. Does not want blood work.   Pt also asking for information on suboxone clinic as social services stated she needed to get into one per pt.

## 2024-03-25 LAB — URINE CULTURE: Culture: <10000 — AB

## 2024-03-29 LAB — CERVICOVAGINAL ANCILLARY ONLY
Bacterial Vaginitis (gardnerella): NEGATIVE
Candida Glabrata: NEGATIVE
Candida Vaginitis: NEGATIVE
Chlamydia: NEGATIVE
Comment: NEGATIVE
Comment: NEGATIVE
Comment: NEGATIVE
Comment: NEGATIVE
Comment: NEGATIVE
Comment: NORMAL
Neisseria Gonorrhea: NEGATIVE
Trichomonas: POSITIVE — AB

## 2024-04-20 ENCOUNTER — Other Ambulatory Visit: Payer: Self-pay

## 2024-04-20 ENCOUNTER — Emergency Department (HOSPITAL_COMMUNITY)
Admission: EM | Admit: 2024-04-20 | Discharge: 2024-04-20 | Disposition: A | Discharge status: Home / Self Care | Payer: MEDICAID

## 2024-04-20 ENCOUNTER — Encounter (HOSPITAL_COMMUNITY): Payer: Self-pay

## 2024-04-20 DIAGNOSIS — Z975 Presence of (intrauterine) contraceptive device: Secondary | ICD-10-CM | POA: Insufficient documentation

## 2024-04-20 DIAGNOSIS — Z30012 Encounter for prescription of emergency contraception: Secondary | ICD-10-CM | POA: Diagnosis present

## 2024-04-20 DIAGNOSIS — Z30019 Encounter for initial prescription of contraceptives, unspecified: Secondary | ICD-10-CM

## 2024-04-20 NOTE — ED Triage Notes (Addendum)
 Pt requesting birth control implant in L arm be removed; denies pain ; states "I just don't like it; it messing with my emotions"; placed approx 1 year ago  Verbal consent given for mse

## 2024-04-20 NOTE — ED Provider Notes (Signed)
 [place] EMERGENCY DEPARTMENT AT [facility] Provider Note   CSN: [id number] Arrival date & time: 04/20/24  1842     History No chief complaint on file.   Pamela Zimmerman is a 38 y.o. female.  Patient with past history significant for heroin abuse, IV drug use, hepatitis C presents to the emergency department today with concerns of birth control removal.  She reports that she has a Nexplanon  implant in her left arm and is requesting to have this removed due to concerns that she does not like makes her feel.  She states that she feels more "emotional" with this implant in place and is concerned that she is going to decline further if it remains in place.  Denies any concerns of vaginal bleeding, spotting, or abdominal discomfort.  She is not currently sexually active.  HPI     Home Medications Prior to Admission medications   Medication Sig Start Date End Date Taking? Authorizing Provider  Ascorbic Acid (VITAMIN C) 100 MG tablet Take 100 mg by mouth daily. Patient not taking: Reported on 03/24/2024    [provider]  etonogestrel  (NEXPLANON ) 68 MG IMPL implant 1 each by Subdermal route once. Left Arm.    [provider]  fluconazole  (DIFLUCAN ) 150 MG tablet Take 1 tablet (150 mg total) by mouth every 3 (three) days. 03/24/24   Dodson Freestone, FNP  ibuprofen  (ADVIL ) 600 MG tablet Take 1 tablet (600 mg total) by mouth every 6 (six) hours as needed for up to 30 doses for mild pain or moderate pain. 09/22/20   Arvilla Birmingham, MD  metroNIDAZOLE  (FLAGYL ) 500 MG tablet Take 1 tablet (500 mg total) by mouth 2 (two) times daily. 03/24/24   Dodson Freestone, FNP  Multiple Vitamin (MULTIVITAMIN) tablet Take 1 tablet by mouth daily. Patient not taking: Reported on 03/24/2024    [provider]  nitrofurantoin (MACRODANTIN) 100 MG capsule Take by mouth. Patient not taking: Reported on 03/24/2024 10/12/19   [provider]      Allergies    Patient has no  known allergies.    Review of Systems   Review of Systems  Genitourinary:        Birth control concerns  All other systems reviewed and are negative.   Physical Exam Updated Vital Signs BP (!) 138/90 (BP Location: Right Arm)   Pulse 92   Temp 99.7 F (37.6 C)   Resp 16   LMP  (LMP Unknown)   SpO2 100%  Physical Exam Vitals and nursing note reviewed.  Constitutional:      General: She is not in acute distress.    Appearance: She is well-developed.  HENT:     Head: Normocephalic and atraumatic.  Eyes:     Conjunctiva/sclera: Conjunctivae normal.  Cardiovascular:     Rate and Rhythm: Normal rate and regular rhythm.     Heart sounds: No murmur heard. Pulmonary:     Effort: Pulmonary effort is normal. No respiratory distress.     Breath sounds: Normal breath sounds.  Abdominal:     Palpations: Abdomen is soft.     Tenderness: There is no abdominal tenderness.  Musculoskeletal:        General: No swelling.     Cervical back: Neck supple.  Skin:    General: Skin is warm and dry.     Capillary Refill: Capillary refill takes less than 2 seconds.     Comments: Palpable Nexplanon  implant in place in the  left upper arm.  Present in 1 segment with no obvious break of the implant.  Neurological:     Mental Status: She is alert.  Psychiatric:        Mood and Affect: Mood normal.     ED Results / Procedures / Treatments   Labs (all labs ordered are listed, but only abnormal results are displayed) Labs Reviewed - No data to display  EKG None  Radiology No results found.  Procedures Procedures    Medications Ordered in ED Medications - No data to display  ED Course/ Medical Decision Making/ A&P                                Medical Decision Making   Problem List / ED Course:  Patient presented to the emergency department today with concerns of Nexplanon  removal.  She reports that she has had this Nexplanon  in place for the last year or so and is concerned  about the side effects that is causing regarding her emotional health.  She reports that she feels more irritable but denies any feelings of suicidality or thoughts of self-harm.  She states that she does not currently have a primary care provider and has not been seen by her OB/GYN since she had the Nexplanon  implanted.  She denies any abdominal discomfort, vaginal bleeding, or any other concerns.  States she is not really sexually active. Palpable implant is palpated to the left upper arm. Advised patient that implanted birth control method such as IUDs and Nexplanon  is or not typically removed in the emergency department setting due to concerns of possible complications with possible unexpected pregnancies.  Advised patient that this can easily be removed by her primary care provider or OB/GYN after careful discussion on birth control options.  Patient will reach out to her OB/GYN for definitive treatment.  Return precautions discussed.  Patient otherwise stable discharge home.  Final Clinical Impression(s) / ED Diagnoses Final diagnoses:  Encounter for female birth control    Rx / DC Orders ED Discharge Orders     None         Concetta Dee, PA-C 04/20/24 1911    Rolinda Climes, DO 04/20/24 2313

## 2024-04-20 NOTE — Discharge Instructions (Signed)
 You were seen today in the emergency department for concerns of her Nexplanon  implant.  Unfortunately we are not able to remove these in the emergency department and I would recommend following up with your OB/GYN or your primary care provider for possible removal or further recommendations.  For any concerns of any other symptoms, please return to the emergency department.

## 2024-06-29 ENCOUNTER — Ambulatory Visit
Admission: EM | Admit: 2024-06-29 | Discharge: 2024-06-29 | Disposition: A | Discharge status: Home / Self Care | Payer: MEDICAID | Attending: Physician Assistant | Admitting: Physician Assistant

## 2024-06-29 DIAGNOSIS — F1110 Opioid abuse, uncomplicated: Secondary | ICD-10-CM

## 2024-06-29 DIAGNOSIS — Z113 Encounter for screening for infections with a predominantly sexual mode of transmission: Secondary | ICD-10-CM

## 2024-06-29 DIAGNOSIS — F1120 Opioid dependence, uncomplicated: Secondary | ICD-10-CM | POA: Diagnosis present

## 2024-06-29 LAB — POCT URINALYSIS DIP (MANUAL ENTRY)
Blood, UA: NEGATIVE
Glucose, UA: NEGATIVE mg/dL
Ketones, POC UA: NEGATIVE mg/dL
Leukocytes, UA: NEGATIVE
Nitrite, UA: NEGATIVE
Protein Ur, POC: 100 mg/dL — AB
Spec Grav, UA: >=1.03 — AB (ref 1.010–1.025)
Urobilinogen, UA: 0.2 U/dL
pH, UA: 6 (ref 5.0–8.0)

## 2024-06-29 NOTE — ED Provider Notes (Signed)
 [name]    CSN: [id number] Arrival date & time: 06/29/24  1349      History   Chief Complaint Chief Complaint  Patient presents with   UTI Symptoms    HPI Pamela Zimmerman is a 38 y.o. female.   Patient here today for evaluation of possible UTI.  She reports she has had some problems when she pees.  She denies any known exposures or concerns for STDs but would like screening for same.  The history is provided by the patient.    Past Medical History:  Diagnosis Date   Depression    had post partum depressin with last delivery   Drug abuse (HCC)    Hepatitis C    Heroin abuse (HCC)     Patient Active Problem List   Diagnosis Date Noted   Nexplanon  in place 06/28/2023   SVD (spontaneous vaginal delivery) 06/27/2023   Indication for care in labor or delivery 06/26/2023   Gram-negative bacteremia 10/12/2019   Chronic hepatitis C (HCC) 09/16/2019   Cellulitis of right forearm 09/16/2019   Dysuria 09/16/2019   Hyponatremia 09/16/2019   Leukocytosis 09/16/2019   Microcytosis 09/16/2019   Wheezing 09/16/2019   Opiate dependence (HCC) 09/16/2019   Right tubal pregnancy without intrauterine pregnancy 09/15/2019   Abscess of skin of left ankle 09/15/2019   Cellulitis of left ankle 09/15/2019   Cutaneous abscess of left lower extremity 09/15/2019   Threatened miscarriage in early pregnancy 02/19/2019   Substance abuse affecting pregnancy in first trimester, antepartum (HCC) 02/19/2019   Cellulitis 01/08/2019   Puncture wound of right hand with infection 01/07/2019   Heroin abuse (HCC) 05/04/2018   Preterm labor 06/17/2017   Group B streptococcal carriage complicating pregnancy 06/08/2017   Opioid use disorder, severe, dependence (HCC) 06/07/2017   Gonorrhea affecting pregnancy in third trimester 06/07/2017   Heroin use affecting pregnancy 06/05/2017   IVDA (intravenous drug abuse) complicating pregnancy (HCC) 06/04/2017   Hepatitis C antibody test  positive 06/04/2017   No prenatal care in current pregnancy in third trimester 06/04/2017   Supervision of high risk pregnancy in third trimester 06/04/2017   H/O preterm delivery, currently pregnant, third trimester 06/04/2017   Thrombophlebitis arm 01/29/2017   Tobacco use disorder 01/05/2017   Pregnancy 03/17/2013   Depression 03/02/2013    Past Surgical History:  Procedure Laterality Date   CHOLECYSTECTOMY     HAND SURGERY Left    I & D EXTREMITY Right 01/08/2019   Procedure: IRRIGATION AND DEBRIDEMENT, RIGHT HAND;  Surgeon: Sebastian Lenis, MD;  Location: [facility] OR;  Service: Orthopedics;  Laterality: Right;   LEG SURGERY Left    Growth was removed.   Right hand      OB History     Gravida  5   Para  4   Term  1   Preterm  2   AB      Living  4      SAB      IAB      Ectopic      Multiple      Live Births  4            Home Medications    Prior to Admission medications   Medication Sig Start Date End Date Taking? Authorizing Provider  etonogestrel  (NEXPLANON ) 68 MG IMPL implant 1 each by Subdermal route once. Left Arm.   Yes [provider]  Ascorbic Acid (VITAMIN C) 100 MG tablet Take 100 mg by mouth  daily. Patient not taking: Reported on 03/24/2024    [provider]  fluconazole  (DIFLUCAN ) 150 MG tablet Take 1 tablet (150 mg total) by mouth every 3 (three) days. 03/24/24   Hazen Darryle BRAVO, FNP  ibuprofen  (ADVIL ) 600 MG tablet Take 1 tablet (600 mg total) by mouth every 6 (six) hours as needed for up to 30 doses for mild pain or moderate pain. 09/22/20   Cottie Donnice PARAS, MD  metroNIDAZOLE  (FLAGYL ) 500 MG tablet Take 1 tablet (500 mg total) by mouth 2 (two) times daily. 03/24/24   Hazen Darryle BRAVO, FNP  Multiple Vitamin (MULTIVITAMIN) tablet Take 1 tablet by mouth daily. Patient not taking: Reported on 03/24/2024    [provider]  nitrofurantoin (MACRODANTIN) 100 MG capsule Take by mouth. Patient not taking: Reported on 03/24/2024  10/12/19   [provider]    Family History Family History  Problem Relation Age of Onset   Cancer Mother        lung   Diabetes Father    Stroke Father    Deafness Brother    Cystic fibrosis Other     Social History Social History   Tobacco Use   Smoking status: Some Days    Current packs/day: 0.50    Types: Cigarettes    Passive exposure: Current   Smokeless tobacco: Never  Vaping Use   Vaping status: Some Days   Substances: Nicotine, Flavoring  Substance Use Topics   Alcohol use: No   Drug use: Yes    Types: Benzodiazepines, IV    Comment: heroin, Fentanyl , most recently used: 07-10 AM.     Allergies   Patient has no known allergies.   Review of Systems Review of Systems  Constitutional:  Negative for chills and fever.  Eyes:  Negative for discharge and redness.  Respiratory:  Negative for shortness of breath.   Gastrointestinal:  Negative for abdominal pain, nausea and vomiting.  Genitourinary:  Positive for dysuria. Negative for vaginal discharge.     Physical Exam Triage Vital Signs ED Triage Vitals  Encounter Vitals Group     BP 06/29/24 1401 92/63     Girls Systolic BP Percentile --      Girls Diastolic BP Percentile --      Boys Systolic BP Percentile --      Boys Diastolic BP Percentile --      Pulse Rate 06/29/24 1401 99     Resp 06/29/24 1401 18     Temp 06/29/24 1401 (!) 97.5 F (36.4 C)     Temp Source 06/29/24 1401 Oral     SpO2 06/29/24 1401 95 %     Weight 06/29/24 1357 130 lb (59 kg)     Height 06/29/24 1357 5' 7 (1.702 m)     Head Circumference --      Peak Flow --      Pain Score 06/29/24 1354 0     Pain Loc --      Pain Education --      Exclude from Growth Chart --    No data found.  Updated Vital Signs BP 92/63 (BP Location: Left Arm)   Pulse 99   Temp (!) 97.5 F (36.4 C) (Oral)   Resp 18   Ht 5' 7 (1.702 m)   Wt 130 lb (59 kg)   LMP  (LMP Unknown)   SpO2 95%   BMI 20.36 kg/m   Visual  Acuity Right Eye Distance:   Left Eye Distance:  Bilateral Distance:    Right Eye Near:   Left Eye Near:    Bilateral Near:     Physical Exam Vitals and nursing note reviewed.  Constitutional:      General: She is not in acute distress.    Appearance: Normal appearance. She is not ill-appearing.  HENT:     Head: Normocephalic and atraumatic.  Eyes:     Conjunctiva/sclera: Conjunctivae normal.  Cardiovascular:     Rate and Rhythm: Normal rate.  Pulmonary:     Effort: Pulmonary effort is normal. No respiratory distress.  Neurological:     Mental Status: She is alert.  Psychiatric:        Mood and Affect: Mood normal.        Behavior: Behavior normal.        Thought Content: Thought content normal.      UC Treatments / Results  Labs (all labs ordered are listed, but only abnormal results are displayed) Labs Reviewed  POCT URINALYSIS DIP (MANUAL ENTRY) - Abnormal; Notable for the following components:      Result Value   Bilirubin, UA small (*)    Spec Grav, UA >=1.030 (*)    Protein Ur, POC =100 (*)    All other components within normal limits  CERVICOVAGINAL ANCILLARY ONLY    EKG   Radiology No results found.  Procedures Procedures (including critical care time)  Medications Ordered in UC Medications - No data to display  Initial Impression / Assessment and Plan / UC Course  I have reviewed the triage vital signs and the nursing notes.  Pertinent labs & imaging results that were available during my care of the patient were reviewed by me and considered in my medical decision making (see chart for details).    UA without signs of UTI.  Concerns for protein in urine and advised follow-up with primary care.  Patient declines offer to have PCP appointment set up today.  Advised follow-up if no gradual improvement or with any worsening symptoms.  Will screen for STDs and will await results for further recommendation.  Contact information provided for local  [employer].  Final Clinical Impressions(s) / UC Diagnoses   Final diagnoses:  Screening for STD (sexually transmitted disease)  Heroin abuse (HCC)  Opioid use disorder, severe, dependence [facility])     Discharge Instructions         Additional resources:  The [place] [employer] Recovering Addicts in the Communities of [place], [place], [place], [place], [place], [place] Abigail ODESSIA [place] Helpline: [phone]   [email or web address]     ED Prescriptions   None    PDMP not reviewed this encounter.   Kendrah, L[place], PA-C 06/29/24 1425

## 2024-06-29 NOTE — ED Triage Notes (Signed)
 I have been having problems when I pee, and I think I have a UTI. No concern for STI but you can test me for that too.

## 2024-06-29 NOTE — Discharge Instructions (Signed)
    Additional resources:  The [place] [employer] Recovering Addicts in the Communities of [place], [place], [place], [place], [place], [place] OD[place] Helpline: [phone]   [email or web address]

## 2024-06-30 ENCOUNTER — Ambulatory Visit (HOSPITAL_COMMUNITY): Payer: Self-pay

## 2024-06-30 LAB — CERVICOVAGINAL ANCILLARY ONLY
Bacterial Vaginitis (gardnerella): POSITIVE — AB
Candida Glabrata: NEGATIVE
Candida Vaginitis: POSITIVE — AB
Chlamydia: NEGATIVE
Comment: NEGATIVE
Comment: NEGATIVE
Comment: NEGATIVE
Comment: NEGATIVE
Comment: NEGATIVE
Comment: NORMAL
Neisseria Gonorrhea: NEGATIVE
Trichomonas: NEGATIVE

## 2024-06-30 MED ORDER — METRONIDAZOLE 500 MG PO TABS
500.0000 mg | ORAL_TABLET | Freq: Two times a day (BID) | ORAL | 0 refills | Status: AC
Start: 2024-06-30 — End: 2024-07-07

## 2024-06-30 MED ORDER — FLUCONAZOLE 150 MG PO TABS: 150.0000 mg | ORAL_TABLET | Freq: Once | ORAL | 0 refills | Status: AC

## 2024-09-05 ENCOUNTER — Ambulatory Visit: Payer: MEDICAID | Admitting: Family Medicine

## 2024-11-27 ENCOUNTER — Ambulatory Visit: Admission: EM | Admit: 2024-11-27 | Discharge: 2024-11-27 | Disposition: A | Discharge status: Home / Self Care

## 2024-11-27 DIAGNOSIS — N760 Acute vaginitis: Secondary | ICD-10-CM

## 2024-11-27 MED ORDER — FLUCONAZOLE 150 MG PO TABS: ORAL_TABLET | ORAL | 1 refills | Status: AC

## 2024-11-27 MED ORDER — METRONIDAZOLE 500 MG PO TABS: 500.0000 mg | ORAL_TABLET | Freq: Two times a day (BID) | ORAL | 0 refills | Status: AC

## 2024-11-27 NOTE — ED Triage Notes (Signed)
 Pt reports 3 days of thick vaginal discharge. No foul odor. Reports she had sex 2 weeks ago with a new partner. States itches really bad. Taking OTC AZO

## 2024-11-27 NOTE — Discharge Instructions (Signed)
 You have been diagnosed with a vaginal infection today. -Be sure to take the medications prescribed as directed and in their entirety. -Use of boric acid vaginal suppositories may assist in vaginal pH maintenance and prevention of yeast and bacterial vaginosis infections, you can start by using twice a week once your symptoms have resolved.  -Be sure to wear clean, cotton underwear and change out of tight, sweaty clothes as soon as possible.

## 2024-11-27 NOTE — ED Provider Notes (Signed)
 [name]    CSN: [id number] Arrival date & time: 11/27/24  1518      History   Chief Complaint Chief Complaint  Patient presents with   Vaginal Discharge    HPI Pamela Zimmerman is a 38 y.o. female.   Pt presents today due to 4-5 days of malodorous, thick, whitish green vaginal discharge with significant vaginal itching. Pt denies concern for STIs.   The history is provided by the patient.  Vaginal Discharge   Past Medical History:  Diagnosis Date   Depression    had post partum depressin with last delivery   Drug abuse (HCC)    Hepatitis C    Heroin abuse (HCC)     Patient Active Problem List   Diagnosis Date Noted   Nexplanon  in place 06/28/2023   SVD (spontaneous vaginal delivery) 06/27/2023   Indication for care in labor or delivery 06/26/2023   Gram-negative bacteremia 10/12/2019   Chronic hepatitis C (HCC) 09/16/2019   Cellulitis of right forearm 09/16/2019   Dysuria 09/16/2019   Hyponatremia 09/16/2019   Leukocytosis 09/16/2019   Microcytosis 09/16/2019   Wheezing 09/16/2019   Opiate dependence (HCC) 09/16/2019   Right tubal pregnancy without intrauterine pregnancy 09/15/2019   Abscess of skin of left ankle 09/15/2019   Cellulitis of left ankle 09/15/2019   Cutaneous abscess of left lower extremity 09/15/2019   Threatened miscarriage in early pregnancy 02/19/2019   Substance abuse affecting pregnancy in first trimester, antepartum (HCC) 02/19/2019   Cellulitis 01/08/2019   Puncture wound of right hand with infection 01/07/2019   Heroin abuse (HCC) 05/04/2018   Preterm labor 06/17/2017   Group B streptococcal carriage complicating pregnancy 06/08/2017   Opioid use disorder, severe, dependence (HCC) 06/07/2017   Gonorrhea affecting pregnancy in third trimester 06/07/2017   Heroin use affecting pregnancy 06/05/2017   IVDA (intravenous drug abuse) complicating pregnancy (HCC) 06/04/2017   Hepatitis C antibody test positive 06/04/2017    No prenatal care in current pregnancy in third trimester 06/04/2017   Supervision of high risk pregnancy in third trimester 06/04/2017   H/O preterm delivery, currently pregnant, third trimester 06/04/2017   Thrombophlebitis arm 01/29/2017   Tobacco use disorder 01/05/2017   Pregnancy 03/17/2013   Depression 03/02/2013    Past Surgical History:  Procedure Laterality Date   CHOLECYSTECTOMY     HAND SURGERY Left    I & D EXTREMITY Right 01/08/2019   Procedure: IRRIGATION AND DEBRIDEMENT, RIGHT HAND;  Surgeon: Sebastian Lenis, MD;  Location: [facility] OR;  Service: Orthopedics;  Laterality: Right;   LEG SURGERY Left    Growth was removed.   Right hand      OB History     Gravida  5   Para  4   Term  1   Preterm  2   AB      Living  4      SAB      IAB      Ectopic      Multiple      Live Births  4            Home Medications    Prior to Admission medications   Medication Sig Start Date End Date Taking? Authorizing Provider  etonogestrel  (NEXPLANON ) 68 MG IMPL implant 1 each by Subdermal route once. Left Arm.   Yes [provider]  fluconazole  (DIFLUCAN ) 150 MG tablet Take 1 tab po every 3 days 11/27/24  Yes Andra Corean BROCKS, PA-C  metroNIDAZOLE  (  FLAGYL ) 500 MG tablet Take 1 tablet (500 mg total) by mouth 2 (two) times daily. 11/27/24  Yes Andra Corean BROCKS, PA-C  Ascorbic Acid (VITAMIN C) 100 MG tablet Take 100 mg by mouth daily. Patient not taking: Reported on 03/24/2024    [provider]  ibuprofen  (ADVIL ) 600 MG tablet Take 1 tablet (600 mg total) by mouth every 6 (six) hours as needed for up to 30 doses for mild pain or moderate pain. Patient not taking: Reported on 11/27/2024 09/22/20   Cottie Donnice PARAS, MD  Multiple Vitamin (MULTIVITAMIN) tablet Take 1 tablet by mouth daily. Patient not taking: Reported on 03/24/2024    [provider]  nitrofurantoin (MACRODANTIN) 100 MG capsule Take by mouth. Patient not taking: Reported  on 03/24/2024 10/12/19   [provider]    Family History Family History  Problem Relation Age of Onset   Cancer Mother        lung   Diabetes Father    Stroke Father    Deafness Brother    Cystic fibrosis Other     Social History Social History   Tobacco Use   Smoking status: Some Days    Current packs/day: 0.50    Types: Cigarettes    Passive exposure: Current   Smokeless tobacco: Never  Vaping Use   Vaping status: Some Days   Substances: Nicotine, Flavoring  Substance Use Topics   Alcohol use: No   Drug use: Yes    Types: Benzodiazepines, IV    Comment: heroin, Fentanyl , most recently used: 07-10 AM.     Allergies   Patient has no known allergies.   Review of Systems Review of Systems  Genitourinary:  Positive for vaginal discharge.     Physical Exam Triage Vital Signs ED Triage Vitals  Encounter Vitals Group     BP 11/27/24 1633 (!) 80/49     Girls Systolic BP Percentile --      Girls Diastolic BP Percentile --      Boys Systolic BP Percentile --      Boys Diastolic BP Percentile --      Pulse Rate 11/27/24 1633 94     Resp 11/27/24 1633 18     Temp 11/27/24 1633 98.3 F (36.8 C)     Temp Source 11/27/24 1633 Oral     SpO2 11/27/24 1633 97 %     Weight --      Height --      Head Circumference --      Peak Flow --      Pain Score 11/27/24 1630 0     Pain Loc --      Pain Education --      Exclude from Growth Chart --    No data found.  Updated Vital Signs BP (!) 86/57 (BP Location: Right Arm)   Pulse 94   Temp 98.3 F (36.8 C) (Oral)   Resp 18   LMP  (LMP Unknown)   SpO2 97%   Breastfeeding No   Visual Acuity Right Eye Distance:   Left Eye Distance:   Bilateral Distance:    Right Eye Near:   Left Eye Near:    Bilateral Near:     Physical Exam Vitals and nursing note reviewed.  Constitutional:      General: She is not in acute distress.    Appearance: Normal appearance. She is not ill-appearing, toxic-appearing or  diaphoretic.  Eyes:     General: No scleral icterus. Cardiovascular:  Rate and Rhythm: Normal rate and regular rhythm.     Heart sounds: Normal heart sounds.  Pulmonary:     Effort: Pulmonary effort is normal. No respiratory distress.     Breath sounds: Normal breath sounds. No wheezing or rhonchi.  Abdominal:     General: Abdomen is flat. Bowel sounds are normal.     Palpations: Abdomen is soft.     Tenderness: There is no abdominal tenderness. There is right CVA tenderness and left CVA tenderness.  Skin:    General: Skin is warm.  Neurological:     Mental Status: She is alert and oriented to person, place, and time.  Psychiatric:        Mood and Affect: Mood normal.        Behavior: Behavior normal.      UC Treatments / Results  Labs (all labs ordered are listed, but only abnormal results are displayed) Labs Reviewed - No data to display  EKG   Radiology No results found.  Procedures Procedures (including critical care time)  Medications Ordered in UC Medications - No data to display  Initial Impression / Assessment and Plan / UC Course  I have reviewed the triage vital signs and the nursing notes.  Pertinent labs & imaging results that were available during my care of the patient were reviewed by me and considered in my medical decision making (see chart for details).    Final Clinical Impressions(s) / UC Diagnoses   Final diagnoses:  Acute vaginitis     Discharge Instructions      You have been diagnosed with a vaginal infection today. -Be sure to take the medications prescribed as directed and in their entirety. -Use of boric acid vaginal suppositories may assist in vaginal pH maintenance and prevention of yeast and bacterial vaginosis infections, you can start by using twice a week once your symptoms have resolved.  -Be sure to wear clean, cotton underwear and change out of tight, sweaty clothes as soon as possible.      ED Prescriptions      Medication Sig Dispense Auth. Provider   fluconazole  (DIFLUCAN ) 150 MG tablet Take 1 tab po every 3 days 2 tablet Andra Krabbe C, PA-C   metroNIDAZOLE  (FLAGYL ) 500 MG tablet Take 1 tablet (500 mg total) by mouth 2 (two) times daily. 14 tablet Andra Krabbe BROCKS, PA-C      PDMP not reviewed this encounter.   Andra Krabbe BROCKS, PA-C 11/27/24 1729

## 2024-12-07 ENCOUNTER — Telehealth: Payer: Self-pay

## 2024-12-07 ENCOUNTER — Ambulatory Visit

## 2024-12-07 NOTE — Telephone Encounter (Signed)
 Called pt to reschedule missed appt; could not reach or leave vm
